# Patient Record
Sex: Female | Born: 1955 | Race: White | Hispanic: No | Marital: Single
Health system: Midwestern US, Community
[De-identification: ages and names within clinical notes are randomized; demographics above are authoritative.]

## PROBLEM LIST (undated history)

## (undated) DIAGNOSIS — D509 Iron deficiency anemia, unspecified: Secondary | ICD-10-CM

## (undated) DIAGNOSIS — K449 Diaphragmatic hernia without obstruction or gangrene: Secondary | ICD-10-CM

## (undated) DIAGNOSIS — K388 Other specified diseases of appendix: Secondary | ICD-10-CM

## (undated) DIAGNOSIS — M545 Low back pain: Secondary | ICD-10-CM

## (undated) DIAGNOSIS — D508 Other iron deficiency anemias: Secondary | ICD-10-CM

## (undated) DIAGNOSIS — R0602 Shortness of breath: Secondary | ICD-10-CM

---

## 2013-11-25 LAB — COMPREHENSIVE METABOLIC PANEL
ALT: 321 U/L — ABNORMAL HIGH (ref 0–33)
AST: 115 U/L — ABNORMAL HIGH (ref 0–35)
Albumin: 4 g/dL (ref 3.9–4.9)
Alkaline Phosphatase: 247 U/L — ABNORMAL HIGH (ref 40–130)
Anion Gap: 15 mEq/L — ABNORMAL HIGH (ref 7–13)
BUN: 11 mg/dL (ref 6–20)
CO2: 23 mEq/L (ref 22–29)
Calcium: 9.3 mg/dL (ref 8.6–10.2)
Chloride: 105 mEq/L (ref 98–107)
Creatinine: 0.82 mg/dL (ref 0.50–0.90)
GFR African American: >60 (ref >60)
GFR Non-African American: >60 (ref >60)
Globulin: 3 g/dL (ref 2.3–3.5)
Glucose: 105 mg/dL (ref 74–109)
Potassium: 4.5 mEq/L (ref 3.5–5.1)
Sodium: 143 mEq/L (ref 132–144)
Total Bilirubin: 0.5 mg/dL (ref 0.0–1.2)
Total Protein: 7 g/dL (ref 6.4–8.1)

## 2013-11-25 LAB — TSH, HIGH SENSITIVE: TSH: 4.11 u[IU]/mL (ref 0.270–4.200)

## 2013-11-25 LAB — VITAMIN D 25 HYDROXY: Vit D, 25-Hydroxy: 40.5 ng/mL (ref 30.0–100.0)

## 2013-11-25 LAB — T4, FREE: T4 Free: 0.84 ng/dL — ABNORMAL LOW (ref 0.93–1.70)

## 2017-10-18 ENCOUNTER — Encounter

## 2017-10-18 ENCOUNTER — Inpatient Hospital Stay: Admit: 2017-10-18 | Payer: PRIVATE HEALTH INSURANCE

## 2017-10-18 DIAGNOSIS — M545 Low back pain: Secondary | ICD-10-CM

## 2018-11-10 ENCOUNTER — Emergency Department: Admit: 2018-11-10 | Payer: PRIVATE HEALTH INSURANCE

## 2018-11-10 ENCOUNTER — Inpatient Hospital Stay
Admit: 2018-11-10 | Discharge: 2018-11-10 | Disposition: A | Discharge status: Home / Self Care | Payer: PRIVATE HEALTH INSURANCE | Attending: Emergency Medicine

## 2018-11-10 DIAGNOSIS — N390 Urinary tract infection, site not specified: Secondary | ICD-10-CM

## 2018-11-10 LAB — COMPREHENSIVE METABOLIC PANEL
ALT: 29 U/L (ref 0–33)
AST: 26 U/L (ref 0–35)
Albumin: 4.2 g/dL (ref 3.5–4.6)
Alkaline Phosphatase: 136 U/L — ABNORMAL HIGH (ref 40–130)
Anion Gap: 13 mEq/L (ref 9–15)
BUN: 20 mg/dL (ref 8–23)
CO2: 22 mEq/L (ref 20–31)
Calcium: 9.9 mg/dL (ref 8.5–9.9)
Chloride: 107 mEq/L (ref 95–107)
Creatinine: 1.07 mg/dL — ABNORMAL HIGH (ref 0.50–0.90)
GFR African American: >60 (ref >60)
GFR Non-African American: 51.7 — ABNORMAL LOW (ref >60)
Globulin: 4 g/dL — ABNORMAL HIGH (ref 2.3–3.5)
Glucose: 177 mg/dL — ABNORMAL HIGH (ref 70–99)
Potassium: 4.7 mEq/L (ref 3.4–4.9)
Sodium: 142 mEq/L (ref 135–144)
Total Bilirubin: 0.3 mg/dL (ref 0.2–0.7)
Total Protein: 8.2 g/dL — ABNORMAL HIGH (ref 6.3–8.0)

## 2018-11-10 LAB — URINALYSIS WITH REFLEX TO CULTURE
Blood, Urine: NEGATIVE
Glucose, Ur: NEGATIVE mg/dL
Nitrite, Urine: NEGATIVE
Protein, UA: NEGATIVE mg/dL
Specific Gravity, UA: 1.025 (ref 1.005–1.030)
Urobilinogen, Urine: 0.2 E.U./dL (ref <2.0)
pH, UA: 5.5 (ref 5.0–9.0)

## 2018-11-10 LAB — CBC WITH AUTO DIFFERENTIAL
Bands Relative: 0 % — ABNORMAL LOW (ref 5–11)
Basophils %: 0 %
Basophils Absolute: 0 10*3/uL (ref 0.0–0.2)
Eosinophils %: 0 %
Eosinophils Absolute: 0 10*3/uL (ref 0.0–0.7)
Hematocrit: 28.6 % — ABNORMAL LOW (ref 37.0–47.0)
Hemoglobin: 8.7 g/dL — ABNORMAL LOW (ref 12.0–16.0)
Lymphocytes %: 14 %
Lymphocytes Absolute: 1.5 10*3/uL (ref 1.0–4.8)
MCH: 21.2 pg — ABNORMAL LOW (ref 27.0–31.3)
MCHC: 30.5 % — ABNORMAL LOW (ref 33.0–37.0)
MCV: 69.4 fL — ABNORMAL LOW (ref 82.0–100.0)
Monocytes %: 4 %
Monocytes Absolute: 0.4 10*3/uL (ref 0.2–0.8)
Neutrophils %: 82 %
Neutrophils Absolute: 9 10*3/uL — ABNORMAL HIGH (ref 1.4–6.5)
PLATELET SLIDE REVIEW: INCREASED
Platelets: 522 10*3/uL — ABNORMAL HIGH (ref 130–400)
RBC: 4.12 M/uL — ABNORMAL LOW (ref 4.20–5.40)
RDW: 21.6 % — ABNORMAL HIGH (ref 11.5–14.5)
WBC: 11 10*3/uL — ABNORMAL HIGH (ref 4.8–10.8)

## 2018-11-10 LAB — EKG 12-LEAD
Atrial Rate: 100 {beats}/min
P Axis: 54 degrees
P-R Interval: 140 ms
Q-T Interval: 386 ms
QRS Duration: 128 ms
QTc Calculation (Bazett): 497 ms
R Axis: -21 degrees
T Axis: 97 degrees
Ventricular Rate: 100 {beats}/min

## 2018-11-10 LAB — TROPONIN: Troponin: <0.01 ng/mL (ref 0.000–0.010)

## 2018-11-10 LAB — MICROSCOPIC URINALYSIS

## 2018-11-10 LAB — APTT: aPTT: 24.7 s (ref 24.4–36.8)

## 2018-11-10 LAB — CK: Total CK: 50 U/L (ref 0–170)

## 2018-11-10 LAB — D-DIMER, QUANTITATIVE: D-Dimer, Quant: 0.27 mg/L FEU (ref 0.00–0.50)

## 2018-11-10 LAB — PROTIME-INR
INR: 1
Protime: 13.5 s (ref 12.3–14.9)

## 2018-11-10 LAB — BRAIN NATRIURETIC PEPTIDE: Pro-BNP: 95 pg/mL

## 2018-11-10 MED ORDER — ALPRAZOLAM 0.25 MG PO TABS
0.25 MG | ORAL_TABLET | Freq: Three times a day (TID) | ORAL | 0 refills | Status: AC | PRN
Start: 2018-11-10 — End: 2018-12-10

## 2018-11-10 MED ORDER — SULFAMETHOXAZOLE-TRIMETHOPRIM 800-160 MG PO TABS
800-160 MG | ORAL_TABLET | Freq: Two times a day (BID) | ORAL | 0 refills | Status: AC
Start: 2018-11-10 — End: 2018-11-17

## 2018-11-10 MED ORDER — LORAZEPAM 2 MG/ML IJ SOLN
2 MG/ML | Freq: Once | INTRAMUSCULAR | Status: AC
Start: 2018-11-10 — End: 2018-11-10
  Administered 2018-11-10: 22:00:00 1 mg via INTRAVENOUS

## 2018-11-10 MED ORDER — ASPIRIN 81 MG PO TBEC
81 MG | ORAL_TABLET | Freq: Every day | ORAL | 3 refills | Status: AC
Start: 2018-11-10 — End: ?

## 2018-11-10 MED ORDER — FERROUS SULFATE 325 (65 FE) MG PO TABS
325 (65 Fe) MG | ORAL_TABLET | Freq: Two times a day (BID) | ORAL | 0 refills | Status: AC
Start: 2018-11-10 — End: ?

## 2018-11-10 MED ORDER — ESOMEPRAZOLE MAGNESIUM 40 MG PO CPDR
40 MG | ORAL_CAPSULE | Freq: Every day | ORAL | 2 refills | Status: AC
Start: 2018-11-10 — End: ?

## 2018-11-10 MED ORDER — KETOROLAC TROMETHAMINE 30 MG/ML IJ SOLN
30 MG/ML | Freq: Once | INTRAMUSCULAR | Status: AC
Start: 2018-11-10 — End: 2018-11-10
  Administered 2018-11-10: 22:00:00 30 mg via INTRAVENOUS

## 2018-11-10 MED ORDER — SODIUM CHLORIDE 0.9 % IV BOLUS
0.9 % | Freq: Once | INTRAVENOUS | Status: AC
Start: 2018-11-10 — End: 2018-11-10
  Administered 2018-11-10: 22:00:00 1000 mL via INTRAVENOUS

## 2018-11-10 MED FILL — ATIVAN 2 MG/ML IJ SOLN: 2 mg/mL | INTRAMUSCULAR | Qty: 1

## 2018-11-10 MED FILL — KETOROLAC TROMETHAMINE 30 MG/ML IJ SOLN: 30 mg/mL | INTRAMUSCULAR | Qty: 1

## 2018-11-10 MED FILL — SODIUM CHLORIDE 0.9 % IV SOLN: 0.9 % | INTRAVENOUS | Qty: 1000

## 2018-11-10 NOTE — ED Provider Notes (Addendum)
[facility] ED  eMERGENCY dEPARTMENT eNCOUnter      Pt Name: Brooke Porter  MRN: [id number]  Birthdate 1956/08/08  Date of evaluation: 11/10/2018  Provider: Magdalene Molly, MD    CHIEF COMPLAINT       Chief Complaint   Patient presents with   ??? Shortness of Breath     upon exertion starting today    ??? Fatigue     for years          HISTORY OF PRESENT ILLNESS   (Location/Symptom, Timing/Onset,Context/Setting, Quality, Duration, Modifying Factors, Severity)  Note limiting factors.   Brooke Porter is a 62 y.o. female who presents to the emergency department with complaint of generalized anxiety, exertional dyspnea, fatigue, of 1 year duration.  Began with chest pressure, shortness of breath this morning.  Was seen by family doctor last week with echocardiogram scheduled in further  work-up.  Chest pressure is 7 in a scale of 1-10.    HPI    Nursing Notes were reviewed.    REVIEW OF SYSTEMS    (2-9 systems for level 4, 10 or more for level 5)     Review of Systems   Constitutional: Positive for fatigue. Negative for activity change, appetite change, chills and fever.   HENT: Negative for congestion, ear discharge, ear pain, hearing loss, rhinorrhea, sinus pressure and sore throat.    Eyes: Negative for photophobia, pain and visual disturbance.   Respiratory: Positive for shortness of breath. Negative for apnea, cough and wheezing.    Cardiovascular: Positive for chest pain. Negative for palpitations and leg swelling.   Gastrointestinal: Negative for abdominal distention, abdominal pain, constipation, diarrhea, nausea and vomiting.   Endocrine: Negative for cold intolerance, heat intolerance and polyuria.   Genitourinary: Negative for dysuria, flank pain, frequency and urgency.   Musculoskeletal: Positive for arthralgias and myalgias. Negative for back pain, gait problem and neck stiffness.   Skin: Negative for color change, pallor and rash.   Allergic/Immunologic: Negative for food allergies and  immunocompromised state.   Neurological: Positive for weakness. Negative for dizziness, tremors, syncope, light-headedness and headaches.   Psychiatric/Behavioral: Negative for agitation, confusion and hallucinations. The patient is nervous/anxious.    All other systems reviewed and are negative.      Except as noted above the remainder of the review of systems was reviewed and negative.       PAST MEDICAL HISTORY     Past Medical History:   Diagnosis Date   ??? Depression    ??? Hiatal hernia    ??? Hyperlipidemia    ??? Overactive bladder    ??? Social anxiety disorder    ??? Thyroid disease    ??? Vitamin D deficiency          SURGICAL HISTORY       Past Surgical History:   Procedure Laterality Date   ??? CHOLECYSTECTOMY     ??? HYSTERECTOMY     ??? TONSILLECTOMY           CURRENT MEDICATIONS       Discharge Medication List as of 11/10/2018  6:28 PM      CONTINUE these medications which have NOT CHANGED    Details   oxybutynin (DITROPAN-XL) 10 MG extended release tablet Historical Med      buPROPion (WELLBUTRIN XL) 300 MG extended release tablet Take 300 mg by mouth dailyHistorical Med      DULoxetine (CYMBALTA) 60 MG extended release capsule Take 120 mg by  mouth dailyHistorical Med      levothyroxine (SYNTHROID) 100 MCG tablet Take 100 mcg by mouth dailyHistorical Med      ergocalciferol (ERGOCALCIFEROL) 1.25 MG (50000 UT) capsule Take 50,000 Units by mouth once a week Labeling may look different.  200 mcg=8000 Units. Please double check dosages.Historical Med      lisinopril (PRINIVIL;ZESTRIL) 10 MG tablet Take 10 mg by mouthHistorical Med             ALLERGIES     Levofloxacin    FAMILY HISTORY     History reviewed. No pertinent family history.       SOCIAL HISTORY       Social History     Socioeconomic History   ??? Marital status: Single     Spouse name: None   ??? Number of children: None   ??? Years of education: None   ??? Highest education level: None   Occupational History   ??? None   Social Needs   ??? Financial resource strain:  None   ??? Food insecurity:     Worry: None     Inability: None   ??? Transportation needs:     Medical: None     Non-medical: None   Tobacco Use   ??? Smoking status: Never Smoker   ??? Smokeless tobacco: Never Used   Substance and Sexual Activity   ??? Alcohol use: Not Currently   ??? Drug use: Never   ??? Sexual activity: None   Lifestyle   ??? Physical activity:     Days per week: None     Minutes per session: None   ??? Stress: None   Relationships   ??? Social connections:     Talks on phone: None     Gets together: None     Attends religious service: None     Active member of club or organization: None     Attends meetings of clubs or organizations: None     Relationship status: None   ??? Intimate partner violence:     Fear of current or ex partner: None     Emotionally abused: None     Physically abused: None     Forced sexual activity: None   Other Topics Concern   ??? None   Social History Narrative   ??? None       SCREENINGS    Glasgow Coma Scale  Eye Opening: Spontaneous  Best Verbal Response: Oriented  Best Motor Response: Obeys commands  Glasgow Coma Scale Score: 15        PHYSICAL EXAM    (up to 7 for level 4, 8 or more for level 5)     ED Triage Vitals [11/10/18 1609]   BP Temp Temp Source Pulse Resp SpO2 Height Weight   119/68 97.8 ??F (36.6 ??C) Oral 119 16 99 % 5\' 8"  (1.727 m) 228 lb (103.4 kg)       Physical Exam  Vitals signs and nursing note reviewed.   Constitutional:       General: She is not in acute distress.     Appearance: Normal appearance. She is well-developed. She is obese. She is ill-appearing. She is not toxic-appearing or diaphoretic.   HENT:      Head: Normocephalic and atraumatic.      Nose: Nose normal. No congestion or rhinorrhea.      Mouth/Throat:      Mouth: Mucous membranes are moist.      Pharynx: Oropharynx is  clear. No oropharyngeal exudate or posterior oropharyngeal erythema.   Eyes:      General: No scleral icterus.        Right eye: No discharge.         Left eye: No discharge.       Conjunctiva/sclera: Conjunctivae normal.      Pupils: Pupils are equal, round, and reactive to light.      Comments: Pale conjuctivae   Neck:      Musculoskeletal: Normal range of motion and neck supple. No neck rigidity or muscular tenderness.      Thyroid: No thyromegaly.      Vascular: No carotid bruit or JVD.      Trachea: No tracheal deviation.   Cardiovascular:      Rate and Rhythm: Regular rhythm. Tachycardia present.      Pulses: Normal pulses.      Heart sounds: Normal heart sounds. No murmur. No friction rub. No gallop.    Pulmonary:      Effort: Pulmonary effort is normal. No respiratory distress.      Breath sounds: No stridor. Rales present. No wheezing or rhonchi.   Chest:      Chest wall: No tenderness.   Abdominal:      General: Bowel sounds are normal. There is no distension.      Palpations: Abdomen is soft. There is no mass.      Tenderness: There is no tenderness. There is no right CVA tenderness, left CVA tenderness, guarding or rebound.      Hernia: No hernia is present.   Musculoskeletal: Normal range of motion.         General: No swelling, tenderness, deformity or signs of injury.      Right lower leg: No edema.      Left lower leg: No edema.   Lymphadenopathy:      Cervical: No cervical adenopathy.   Skin:     General: Skin is warm and dry.      Coloration: Skin is not jaundiced or pale.      Findings: No bruising, erythema, lesion or rash.   Neurological:      Mental Status: She is alert and oriented to person, place, and time.      Cranial Nerves: No cranial nerve deficit.      Sensory: No sensory deficit.      Motor: Weakness present. No abnormal muscle tone.      Coordination: Coordination normal.      Gait: Gait normal.      Deep Tendon Reflexes: Reflexes are normal and symmetric. Reflexes normal.   Psychiatric:         Behavior: Behavior normal.         Thought Content: Thought content normal.         Judgment: Judgment normal.      Comments: Mood is depressed         DIAGNOSTIC  RESULTS     EKG: All EKG's are interpreted by the Emergency Department Physician who either signs or Co-signs this chart in the absence of a cardiologist.    Twelve-lead EKG shows sinus rhythm, rate 100 bpm, left axis deviation, left bundle branch block, no acute ST-T wave changes.    RADIOLOGY:   Non-plain film images such as CT, Ultrasound and MRI are read by the radiologist. Plain radiographicimages are visualized and preliminarily interpreted by the emergency physician with the below findings:    Chest x-ray shows no acute cardiopulmonary pathology.  Interpretation per the Radiologist below, if available at the time of this note:    XR CHEST PORTABLE    (Results Pending)         ED BEDSIDE ULTRASOUND:   Performed by ED Physician - none    LABS:  Labs Reviewed   COMPREHENSIVE METABOLIC PANEL - Abnormal; Notable for the following components:       Result Value    Glucose 177 (*)     CREATININE 1.07 (*)     GFR Non-African American 51.7 (*)     Total Protein 8.2 (*)     Alkaline Phosphatase 136 (*)     Globulin 4.0 (*)     All other components within normal limits   CBC WITH AUTO DIFFERENTIAL - Abnormal; Notable for the following components:    WBC 11.0 (*)     RBC 4.12 (*)     Hemoglobin 8.7 (*)     Hematocrit 28.6 (*)     MCV 69.4 (*)     MCH 21.2 (*)     MCHC 30.5 (*)     RDW 21.6 (*)     Platelets 522 (*)     Neutrophils Absolute 9.0 (*)     Bands Relative 0 (*)     All other components within normal limits   IRON AND TIBC - Abnormal; Notable for the following components:    Iron 17 (*)     Iron Saturation 4 (*)     All other components within normal limits   FERRITIN - Abnormal; Notable for the following components:    Ferritin 7.0 (*)     All other components within normal limits   MICROSCOPIC URINALYSIS - Abnormal; Notable for the following components:    WBC, UA 6-10 (*)     All other components within normal limits   URINE CULTURE   PROTIME-INR   APTT   CK   TROPONIN   D-DIMER, QUANTITATIVE   BRAIN  NATRIURETIC PEPTIDE   URINE RT REFLEX TO CULTURE       All other labs were within normal range or not returned as of this dictation.    EMERGENCY DEPARTMENT COURSE and DIFFERENTIALDIAGNOSIS/MDM:   Vitals:    Vitals:    11/10/18 1723 11/10/18 1745 11/10/18 1748 11/10/18 1846   BP: (!) 101/56 113/62 113/62 110/66   Pulse:   83 84   Resp: 16  16 16    Temp:   98 ??F (36.7 ??C) 98.4 ??F (36.9 ??C)   TempSrc:   Oral Oral   SpO2:   96% 98%   Weight:       Height:               MDM  Number of Diagnoses or Management Options     Amount and/or Complexity of Data Reviewed  Clinical lab tests: reviewed and ordered  Tests in the radiology section of CPT??: reviewed and ordered  Tests in the medicine section of CPT??: ordered and reviewed    Risk of Complications, Morbidity, and/or Mortality  Presenting problems: moderate  Diagnostic procedures: moderate  Management options: moderate    Patient Progress  Patient progress: improved      CRITICAL CARE TIME   Total Critical Care time was  minutes, excluding separately reportable procedures.  There was a high probability of clinically significant/life threatening deterioration in the patient's condition which required my urgentintervention.     CONSULTS:  None    PROCEDURES:  Unless otherwise noted  below, none     Procedures    FINAL IMPRESSION      1. General weakness    2. Microcytic anemia    3. Anxiety state    4. Dyspnea, unspecified type    5. Abnormal EKG    6. Acute UTI          DISPOSITION/PLAN   DISPOSITION Decision To Discharge 11/10/2018 06:46:07 PM      PATIENT REFERRED [place]  [address], [address]  [place] [address]  [phone]    In 2 days      [place]  [address], [address]  [place] [address]  [phone]            DISCHARGE MEDICATIONS:  Discharge Medication List as of 11/10/2018  6:28 PM      START taking these medications    Details   aspirin (ECOTRIN LOW STRENGTH) 81 MG EC tablet Take 1 tablet by mouth  daily, Disp-30 tablet, R-3Print      esomeprazole (NEXIUM) 40 MG delayed release capsule Take 1 capsule by mouth every morning (before breakfast), Disp-30 capsule, R-2Print      ALPRAZolam (XANAX) 0.25 MG tablet Take 1 tablet by mouth 3 times daily as needed for Anxiety for up to 30 days., Disp-10 tablet, R-0Print      ferrous sulfate 325 (65 Fe) MG tablet Take 1 tablet by mouth 2 times daily, Disp-60 tablet, R-0Print      sulfamethoxazole-trimethoprim (BACTRIM DS) 800-160 MG per tablet Take 1 tablet by mouth 2 times daily for 7 days, Disp-14 tablet, R-0Print                (Please note that portions of this note were completed with a voice recognitionprogram.  Efforts were made to edit the dictations but occasionally words are mis-transcribed.)    Magdalene Molly, MD (electronically signed)  Attending Emergency Physician          Magdalene Molly, MD  11/10/18 1758       Magdalene Molly, MD  11/10/18 2207

## 2018-11-10 NOTE — ED Notes (Signed)
Patient updated on POC. Family at bedside.      Caren HazyAngela S Herve Haug, RN  11/10/18 1723

## 2018-11-10 NOTE — ED Triage Notes (Signed)
Patient to room #8 for c/o shortness of breath x1 year and worsening, heaviness in chest x3 days, tightness under left shoulder blade x1 day, and throat tightness x3 weeks. Patient placed on monitor, EKG obtained, [facility], labs drawn and sent.

## 2018-11-11 LAB — IRON AND TIBC
Iron % Saturation: 4 % — ABNORMAL LOW (ref 11–46)
Iron: 17 ug/dL — ABNORMAL LOW (ref 37–145)
TIBC: 414 ug/dL (ref 178–450)

## 2018-11-11 LAB — FERRITIN: Ferritin: 7 ng/mL — ABNORMAL LOW (ref 13.0–150.0)

## 2018-11-12 ENCOUNTER — Ambulatory Visit: Admit: 2018-11-12 | Discharge: 2018-11-12 | Payer: PRIVATE HEALTH INSURANCE | Attending: Gastroenterology

## 2018-11-12 DIAGNOSIS — D509 Iron deficiency anemia, unspecified: Secondary | ICD-10-CM

## 2018-11-12 LAB — CULTURE, URINE: Urine Culture, Routine: NO GROWTH

## 2018-11-12 NOTE — Progress Notes (Signed)
Subjective:      Patient ID: Brooke Porter is a 61 y.o. female who presents today for:  Chief Complaint   Patient presents with   . Consultation   . GI Bleeding   . Gastroesophageal Reflux   . Nausea       HPI  This is a very pleasant 62 year old who came in today for further evaluation and management of iron deficiency anemia.  Patient denies any overt bleeding in term of hematemesis hematochezia, or black stool/melena.  She reported that she has been having fatigue and tiredness over the last year or two.  She does report exertional dyspnea.  Patient mentioned that she had the EGD and colonoscopy at outside facility within 1 year or almost 1 year back.  And was negative except for a hiatal hernia.  At the time of the EGD and colonoscopy patient was reporting exertional shortness of breath.  Current hemoglobin is 8.7 from 9.4 in 03/12/2018.  Noted hemoglobin 11.8 in 2018 down from 13.8 in 2016.  Otherwise patient has iron studies and with evidence of  iron deficiency anemia.  Denies NSAID use.  No anticoagulants reported.  Patient mentioned that she had sent EKG that shows left branch bundle block and currently has been followed with cardiology was told to have normal EKG prior to that.  She came in today for initial visit  Past Medical History:   Diagnosis Date   . Depression    . Hiatal hernia    . Hyperlipidemia    . Overactive bladder    . Social anxiety disorder    . Thyroid disease    . Vitamin D deficiency      Past Surgical History:   Procedure Laterality Date   . CHOLECYSTECTOMY     . HYSTERECTOMY     . TONSILLECTOMY       Social History     Socioeconomic History   . Marital status: Single     Spouse name: Not on file   . Number of children: Not on file   . Years of education: Not on file   . Highest education level: Not on file   Occupational History   . Not on file   Social Needs   . Financial resource strain: Not on file   . Food insecurity:     Worry: Not on file     Inability: Not on file   .  Transportation needs:     Medical: Not on file     Non-medical: Not on file   Tobacco Use   . Smoking status: Never Smoker   . Smokeless tobacco: Never Used   Substance and Sexual Activity   . Alcohol use: Not Currently   . Drug use: Never   . Sexual activity: Not on file   Lifestyle   . Physical activity:     Days per week: Not on file     Minutes per session: Not on file   . Stress: Not on file   Relationships   . Social connections:     Talks on phone: Not on file     Gets together: Not on file     Attends religious service: Not on file     Active member of club or organization: Not on file     Attends meetings of clubs or organizations: Not on file     Relationship status: Not on file   . Intimate partner violence:     Fear of  current or ex partner: Not on file     Emotionally abused: Not on file     Physically abused: Not on file     Forced sexual activity: Not on file   Other Topics Concern   . Not on file   Social History Narrative   . Not on file     Family History   Problem Relation Age of Onset   . Celiac Disease Neg Hx    . Colon Cancer Neg Hx    . Crohn's Disease Neg Hx      Allergies   Allergen Reactions   . Levofloxacin      Other reaction(s): Other: See Comments  tendinitis all over the muscles  Other reaction(s): Other: See Comments  tendinitis all over the muscles           Review of Systems   Constitutional: Negative for appetite change, chills, fatigue, fever and unexpected weight change.   HENT: Negative for nosebleeds, tinnitus, trouble swallowing and voice change.    Eyes: Negative for photophobia, pain and redness.   Respiratory: Positive for shortness of breath. Negative for chest tightness and wheezing.    Cardiovascular: Negative for chest pain, palpitations and leg swelling.   Gastrointestinal: Negative for abdominal distention, abdominal pain, blood in stool, constipation, diarrhea, nausea, rectal pain and vomiting.   Endocrine: Negative for polydipsia, polyphagia and polyuria.    Genitourinary: Negative for difficulty urinating and hematuria.   Skin: Negative for color change, pallor and rash.   Neurological: Negative for dizziness, speech difficulty and headaches.   Psychiatric/Behavioral: Negative for confusion and suicidal ideas.       Objective:   BP 136/62 (Site: Left Upper Arm, Position: Sitting, Cuff Size: Large Adult)   Pulse 111   Ht 5\' 8"  (1.727 m)   Wt 232 lb (105.2 kg)   SpO2 98%   BMI 35.28 kg/m     Physical Exam  Constitutional:       General: She is not in acute distress.     Appearance: She is well-developed.   HENT:      Head: Normocephalic and atraumatic.   Eyes:      Conjunctiva/sclera: Conjunctivae normal.      Pupils: Pupils are equal, round, and reactive to light.   Neck:      Musculoskeletal: Neck supple.   Cardiovascular:      Rate and Rhythm: Normal rate and regular rhythm.      Heart sounds: Normal heart sounds.   Pulmonary:      Effort: Pulmonary effort is normal. No respiratory distress.      Breath sounds: Normal breath sounds. No wheezing or rales.   Abdominal:      General: Bowel sounds are normal. There is no distension.      Palpations: Abdomen is soft. Abdomen is not rigid. There is no hepatomegaly, splenomegaly or mass.      Tenderness: There is no tenderness. There is no guarding or rebound.   Musculoskeletal: Normal range of motion.         General: No tenderness or deformity.   Skin:     Coloration: Skin is not pale.      Findings: No erythema or rash.   Neurological:      Mental Status: She is alert and oriented to person, place, and time.         Laboratory, Pathology, Radiology reviewed in detail with relevantimportant investigations summarized below:  Lab Results   Component Value Date  WBC 11.0 11/10/2018    HGB 8.7 11/10/2018    HCT 28.6 11/10/2018    MCV 69.4 11/10/2018    PLT 522 11/10/2018    .  Lab Results   Component Value Date    ALT 29 11/10/2018    ALT 321 11/25/2013    AST 26 11/10/2018    AST 115 11/25/2013    ALKPHOS 136  11/10/2018    ALKPHOS 247 11/25/2013    BILITOT 0.3 11/10/2018    BILITOT 0.5 11/25/2013       Xr Chest Portable    Result Date: 11/11/2018  EXAMINATION: XR CHEST PORTABLE CLINICAL HISTORY:  shortness of breath COMPARISONS: 07/25/2010 FINDINGS: Cardiac size is borderline. Pulmonary vascularity is normal. The lungs are clear. There are scattered calcifications from old granulomatous disease. There are degenerative changes of the shoulders. There is spinal fusion hardware in the lower cervical spine. There is an old healed fracture of the anterior right 6th rib.     NO ACUTE CHEST DISEASE    Lab Results   Component Value Date    IRON 17 11/10/2018    TIBC 414 11/10/2018    FERRITIN 7.0 11/10/2018     Lab Results   Component Value Date    INR 1.0 11/10/2018     No components found for: ACUTEHEPATITISSCREEN  No components found for: CELIACPANEL  No components found for: STOOLCULTURE, C.DIFF, STOOLOVAPARASITE, STOOLLEUCOCYTE        Assessment:       Diagnosis Orders   1. Iron deficiency anemia, unspecified iron deficiency anemia type  Endoscopy, GI with capsule         Plan:      Orders Placed This Encounter   Procedures   . Endoscopy, GI with capsule     Standing Status:   Future     Standing Expiration Date:   12/12/2018     Order Specific Question:   Pre-procedure Diagnosis     Answer:   ida     Order Specific Question:   Pre-admission testing needed?     Answer:   No [0]   Iron deficiency anemia/ IDA  No overt bleeding reported at this time  Patient had EGD colonoscopy within 1 year and reported to be negative except for hiatal hernia  Will obtain endoscopy report for review.  If negative, we will proceed with capsule endoscopy  Patient may need a repeat EGD/ colonoscopy pending findings  Continue iron supplements.  Hold 1 week prior to capsule  Left branch branch bundle block   With exertional shortness of breath.  She reports that she was told to have abnormal EKG which is new compared to previous.  Currently patient  has been following with cardiology.  Will need clearance prior to proceeding to repeat endoscopy  Associated medical conditions   hiatal hernia, anxiety depression, dyslipidemia, thyroid disorder  Return in about 4 weeks (around 12/10/2018) for Post procedure results discussion, further management.      Thamas Jaegers, MD

## 2018-11-26 ENCOUNTER — Ambulatory Visit: Admit: 2018-11-26 | Discharge: 2018-11-26 | Payer: PRIVATE HEALTH INSURANCE | Attending: Gastroenterology

## 2018-11-26 DIAGNOSIS — D509 Iron deficiency anemia, unspecified: Secondary | ICD-10-CM

## 2018-11-26 MED ORDER — NA SULFATE-K SULFATE-MG SULF 17.5-3.13-1.6 GM/177ML PO SOLN
ORAL | 0 refills | Status: AC
Start: 2018-11-26 — End: ?

## 2018-11-26 NOTE — Progress Notes (Signed)
Subjective:      Patient ID: Brooke Porter is a 62 y.o. female who presents today for:  Chief Complaint   Patient presents with   ??? Follow-up   ??? Anemia       Today visit:  Patient came in today for follow-up visit.  Since last seen, patient had cardiac catheterization that was negative for coronary artery disease.  Patient received IV iron during recent hospital stay at [facility].  And came in today for follow-up and future plan of care.  No overt bleeding reported in terms of hematemesis melena hematochezia.  Background  This is a very pleasant 62 year old who came in today for further evaluation and management of iron deficiency anemia.  Patient denies any overt bleeding in term of hematemesis hematochezia, or black stool/melena.  She reported that she has been having fatigue and tiredness over the last year or two.  She does report exertional dyspnea.  Patient mentioned that she had the EGD and colonoscopy at outside facility within 1 year or almost 1 year back.  And was negative except for a hiatal hernia.  At the time of the EGD and colonoscopy patient was reporting exertional shortness of breath.  Current hemoglobin is 8.7 from 9.4 in 03/12/2018.  Noted hemoglobin 11.8 in 2018 down from 13.8 in 2016.  Otherwise patient has iron studies and with evidence of  iron deficiency anemia.  Denies NSAID use.  No anticoagulants reported.  Patient mentioned that she had sent EKG that shows left branch bundle block and currently has been followed with cardiology was told to have normal EKG prior to that.  She came in today for initial visit  Past Medical History:   Diagnosis Date   ??? Depression    ??? Hiatal hernia    ??? Hyperlipidemia    ??? Overactive bladder    ??? Social anxiety disorder    ??? Thyroid disease    ??? Vitamin D deficiency      Past Surgical History:   Procedure Laterality Date   ??? CHOLECYSTECTOMY     ??? HYSTERECTOMY     ??? TONSILLECTOMY       Social History     Socioeconomic History   ??? Marital status: Single      Spouse name: Not on file   ??? Number of children: Not on file   ??? Years of education: Not on file   ??? Highest education level: Not on file   Occupational History   ??? Not on file   Social Needs   ??? Financial resource strain: Not on file   ??? Food insecurity:     Worry: Not on file     Inability: Not on file   ??? Transportation needs:     Medical: Not on file     Non-medical: Not on file   Tobacco Use   ??? Smoking status: Never Smoker   ??? Smokeless tobacco: Never Used   Substance and Sexual Activity   ??? Alcohol use: Not Currently   ??? Drug use: Never   ??? Sexual activity: Not on file   Lifestyle   ??? Physical activity:     Days per week: Not on file     Minutes per session: Not on file   ??? Stress: Not on file   Relationships   ??? Social connections:     Talks on phone: Not on file     Gets together: Not on file     Attends religious service:  Not on file     Active member of club or organization: Not on file     Attends meetings of clubs or organizations: Not on file     Relationship status: Not on file   ??? Intimate partner violence:     Fear of current or ex partner: Not on file     Emotionally abused: Not on file     Physically abused: Not on file     Forced sexual activity: Not on file   Other Topics Concern   ??? Not on file   Social History Narrative   ??? Not on file     Family History   Problem Relation Age of Onset   ??? Celiac Disease Neg Hx    ??? Colon Cancer Neg Hx    ??? Crohn's Disease Neg Hx      Allergies   Allergen Reactions   ??? Levofloxacin      Other reaction(s): Other: See Comments  tendinitis all over the muscles  Other reaction(s): Other: See Comments  tendinitis all over the muscles           Review of Systems   Constitutional: Negative for appetite change, chills, fatigue, fever and unexpected weight change.   HENT: Negative for nosebleeds, tinnitus, trouble swallowing and voice change.    Eyes: Negative for photophobia, pain and redness.   Respiratory: Negative for chest tightness, shortness of breath and  wheezing.    Cardiovascular: Negative for chest pain, palpitations and leg swelling.   Gastrointestinal: Negative for abdominal distention, abdominal pain, blood in stool, constipation, diarrhea, nausea, rectal pain and vomiting.   Endocrine: Negative for polydipsia, polyphagia and polyuria.   Genitourinary: Negative for difficulty urinating and hematuria.   Skin: Negative for color change, pallor and rash.   Neurological: Negative for dizziness, speech difficulty and headaches.   Psychiatric/Behavioral: Negative for confusion and suicidal ideas.       Objective:   BP 122/72 (Site: Right Upper Arm, Position: Sitting, Cuff Size: Medium Adult)    Pulse 90    Ht 5\' 8"  (1.727 m)    Wt 225 lb (102.1 kg)    SpO2 98%    BMI 34.21 kg/m??     Physical Exam  Constitutional:       General: She is not in acute distress.     Appearance: She is well-developed.   HENT:      Head: Normocephalic and atraumatic.   Eyes:      Conjunctiva/sclera: Conjunctivae normal.      Pupils: Pupils are equal, round, and reactive to light.   Neck:      Musculoskeletal: Neck supple.   Cardiovascular:      Rate and Rhythm: Normal rate and regular rhythm.      Heart sounds: Normal heart sounds.   Pulmonary:      Effort: Pulmonary effort is normal. No respiratory distress.      Breath sounds: Normal breath sounds. No wheezing or rales.   Abdominal:      General: Bowel sounds are normal. There is no distension.      Palpations: Abdomen is soft. Abdomen is not rigid. There is no hepatomegaly, splenomegaly or mass.      Tenderness: There is no tenderness. There is no guarding or rebound.   Musculoskeletal: Normal range of motion.         General: No tenderness or deformity.   Skin:     Coloration: Skin is not pale.  Findings: No erythema or rash.   Neurological:      Mental Status: She is alert and oriented to person, place, and time.         Laboratory, Pathology, Radiology reviewed in detail with relevantimportant investigations summarized  below:  Lab Results   Component Value Date    WBC 11.0 11/10/2018    HGB 8.7 11/10/2018    HCT 28.6 11/10/2018    MCV 69.4 11/10/2018    PLT 522 11/10/2018    .  Lab Results   Component Value Date    ALT 29 11/10/2018    ALT 321 11/25/2013    AST 26 11/10/2018    AST 115 11/25/2013    ALKPHOS 136 11/10/2018    ALKPHOS 247 11/25/2013    BILITOT 0.3 11/10/2018    BILITOT 0.5 11/25/2013       Xr Chest Portable    Result Date: 11/11/2018  EXAMINATION: XR CHEST PORTABLE CLINICAL HISTORY:  shortness of breath COMPARISONS: 07/25/2010 FINDINGS: Cardiac size is borderline. Pulmonary vascularity is normal. The lungs are clear. There are scattered calcifications from old granulomatous disease. There are degenerative changes of the shoulders. There is spinal fusion hardware in the lower cervical spine. There is an old healed fracture of the anterior right 6th rib.     NO ACUTE CHEST DISEASE    Lab Results   Component Value Date    IRON 17 11/10/2018    TIBC 414 11/10/2018    FERRITIN 7.0 11/10/2018     Lab Results   Component Value Date    INR 1.0 11/10/2018     No components found for: ACUTEHEPATITISSCREEN  No components found for: CELIACPANEL  No components found for: STOOLCULTURE, C.DIFF, STOOLOVAPARASITE, STOOLLEUCOCYTE        Assessment:   Iron deficiency anemia/ IDA  Previous EGD and colonoscopy reports from 09/2017 reviewed.  We will proceed with repeat evaluation with push enteroscopy and colonoscopy.  If negative initial evaluation, will proceed with capsule endoscopy.  Noted last EGD showed evidence of hiatal hernia almost 5 cm size.  Will assess for any Sheria LangCameron lesion, patient does evidence of GERD.   We will continue PPI at this time  Explained procedures, risk and benefit reviewed.  Patient would like to proceed accordingly  Left branch branch bundle block   [facility]  Associated medical conditions   Hiatal hernia, anxiety depression, dyslipidemia, thyroid  disorder    Return in about 4 weeks (around 12/10/2018) for Post procedure results discussion, further management.        Thamas JaegersHicham Jiayi Lengacher, MD

## 2018-11-26 NOTE — Telephone Encounter (Signed)
Waiting on insurance to approve Capsule Endoscopy. Faxed it on 11-13-18.

## 2018-12-02 NOTE — Telephone Encounter (Signed)
[name] called from [facility] and stated the capsule was denied by her insurance due to her having a scope done in 2018 and not having it done in 2019. [name] said you can appeal it.

## 2018-12-04 ENCOUNTER — Inpatient Hospital Stay: Payer: PRIVATE HEALTH INSURANCE

## 2018-12-04 MED ORDER — LIDOCAINE HCL 2 % IJ SOLN
2 % | INTRAMUSCULAR | Status: DC | PRN
Start: 2018-12-04 — End: 2018-12-04
  Administered 2018-12-04: 14:00:00 40 via INTRAVENOUS

## 2018-12-04 MED ORDER — ONDANSETRON HCL 4 MG/2ML IJ SOLN
4 MG/2ML | Freq: Once | INTRAMUSCULAR | Status: DC | PRN
Start: 2018-12-04 — End: 2018-12-04

## 2018-12-04 MED ORDER — NORMAL SALINE FLUSH 0.9 % IV SOLN
0.9 % | INTRAVENOUS | Status: DC | PRN
Start: 2018-12-04 — End: 2018-12-04

## 2018-12-04 MED ORDER — NORMAL SALINE FLUSH 0.9 % IV SOLN
0.9 % | Freq: Two times a day (BID) | INTRAVENOUS | Status: DC
Start: 2018-12-04 — End: 2018-12-04

## 2018-12-04 MED ORDER — SODIUM CHLORIDE 0.9 % IV SOLN
0.9 % | INTRAVENOUS | Status: DC
Start: 2018-12-04 — End: 2018-12-04
  Administered 2018-12-04: 14:00:00 via INTRAVENOUS

## 2018-12-04 MED ORDER — LIDOCAINE HCL (PF) 1 % IJ SOLN
1 % | Freq: Once | INTRAMUSCULAR | Status: DC | PRN
Start: 2018-12-04 — End: 2018-12-04

## 2018-12-04 MED ORDER — NA SULFATE-K SULFATE-MG SULF 17.5-3.13-1.6 GM/177ML PO SOLN
17.5-3.13-1.6 | PACK | Freq: Once | ORAL | 0 refills | Status: AC
Start: 2018-12-04 — End: 2018-12-04

## 2018-12-04 MED ORDER — PROPOFOL 200 MG/20ML IV EMUL
200 MG/20ML | INTRAVENOUS | Status: DC | PRN
Start: 2018-12-04 — End: 2018-12-04
  Administered 2018-12-04: 14:00:00 30 via INTRAVENOUS
  Administered 2018-12-04: 14:00:00 50 via INTRAVENOUS
  Administered 2018-12-04: 14:00:00 30 via INTRAVENOUS

## 2018-12-04 MED FILL — MONOJECT FLUSH SYRINGE 0.9 % IV SOLN: 0.9 % | INTRAVENOUS | Qty: 10

## 2018-12-04 NOTE — H&P (Signed)
Patient Name: Brooke Porter  DOB: 1956-03-19  MRN: [id number]  DATE: 12/04/18      ENDOSCOPY  History and Physical    Procedure:    []  Diagnostic Colonoscopy       []  Screening Colonoscopy  [x]  EGD      []  ERCP      []  EUS       []  Other    [x]  Previous office notes/History and Physical reviewed from the patients chart. Please see EMR for further details of HPI. I have examined the patient's status immediately prior to the procedure and:      Indications/HPI:    [] Abdominal Pain   [] Cancer- GI/Lung  [] Fhx of colon CA/polyps  [] History of Polyps   [] Barrett???s   [] Melena  [] Abnormal Imaging   [] Dysphagia    [] Persistent Pneumonia  [x] Anemia   [] Food Impaction  [] History of Polyps  [] GI Bleed   [] Pulmonary nodule/Mass  [] Change in bowel habits  [] Heartburn/Reflux  [] Rectal Bleed (BRBPR)  [] Chest Pain - Non Cardiac  [] Heme (+) Stool  [] Ulcers  [] Constipation   [] Hemoptysis   [] Varices  [] Diarrhea   [] Hypoxemia  [x] Nausea/Vomiting   [] Screening   [] Crohns/Colitis  [] Other: Anemia ( IDA)    Anesthesia:   [x]  MAC []  Moderate Sedation   []  General   []  None     ROS: 12 pt Review of Symptoms was negative unless mentioned above    Medications:   Prior to Admission medications    Medication Sig Start Date End Date Taking? Authorizing Provider   Na Sulfate-K Sulfate-Mg Sulf 17.5-3.13-1.6 GM/177ML SOLN As directed 11/26/18   Dalbert Garnet, MD   lisinopril (PRINIVIL;ZESTRIL) 10 MG tablet Take 10 mg by mouth    Historical Provider, MD   oxybutynin (DITROPAN-XL) 10 MG extended release tablet  08/24/15   Historical Provider, MD   buPROPion (WELLBUTRIN XL) 300 MG extended release tablet Take 300 mg by mouth daily 09/23/15   Historical Provider, MD   DULoxetine (CYMBALTA) 60 MG extended release capsule Take 120 mg by mouth daily 09/23/15   Historical Provider, MD   levothyroxine (SYNTHROID) 100 MCG tablet Take 100 mcg by mouth daily 02/10/17   Historical Provider, MD   ergocalciferol (ERGOCALCIFEROL) 1.25 MG (50000 UT) capsule Take  50,000 Units by mouth once a week     Historical Provider, MD   aspirin (ECOTRIN LOW STRENGTH) 81 MG EC tablet Take 1 tablet by mouth daily 11/10/18   Gunnar Bulla, MD   esomeprazole (NEXIUM) 40 MG delayed release capsule Take 1 capsule by mouth every morning (before breakfast) 11/10/18   Gunnar Bulla, MD   ALPRAZolam Duanne Moron) 0.25 MG tablet Take 1 tablet by mouth 3 times daily as needed for Anxiety for up to 30 days. 11/10/18 12/10/18  Gunnar Bulla, MD   ferrous sulfate 325 (65 Fe) MG tablet Take 1 tablet by mouth 2 times daily 11/10/18   Gunnar Bulla, MD       Allergies:   Allergies   Allergen Reactions   ??? Levofloxacin      Other reaction(s): Other: See Comments  tendinitis all over the muscles  Other reaction(s): Other: See Comments  tendinitis all over the muscles          History of allergic reaction to anesthesia:  No    Past Medical History:  Past Medical History:   Diagnosis Date   ??? Depression    ??? Hiatal hernia    ??? Hyperlipidemia    ???  Overactive bladder    ??? Social anxiety disorder    ??? Thyroid disease    ??? Vitamin D deficiency        Past Surgical History:  Past Surgical History:   Procedure Laterality Date   ??? CHOLECYSTECTOMY     ??? HYSTERECTOMY     ??? TONSILLECTOMY         Social History:  Social History     Tobacco Use   ??? Smoking status: Never Smoker   ??? Smokeless tobacco: Never Used   Substance Use Topics   ??? Alcohol use: Not Currently   ??? Drug use: Never       Vital Signs:   Vitals:    12/04/18 0907   BP: (!) 164/76   Pulse: 115   Resp: 18   Temp: 99.1 ??F (37.3 ??C)   SpO2: 96%        Physical Exam:  Cardiac:  [x] WNL  [] Comments:  Pulmonary:  [x] WNL   [] Comments:   Neuro/Mental Status:  [x] WNL  [] Comments:  Abdominal:  [x] WNL    [] Comments:  Other:   [] WNL  [] Comments:    Informed Consent:  The risks and benefits of the procedure have been discussed with either the patient or if they cannot consent, their representative.    Assessment:  Patient examined and appropriate for planned  sedation and procedure.     Plan:  Proceed with planned sedation and procedure as above.    Dalbert Garnet, MD  9:09 AM

## 2018-12-04 NOTE — Anesthesia Pre-Procedure Evaluation (Signed)
Department of Anesthesiology  Preprocedure Note       Name:  Brooke Porter   Age:  62 y.o.  DOB:  11-01-1956                                          MRN:  [id number]         Date:  12/04/2018      Surgeon: Brooke Porter):  Brooke Garnet, MD    Procedure: EGD ESOPHAGOGASTRODUODENOSCOPY (N/A )    Medications prior to admission:   Prior to Admission medications    Medication Sig Start Date End Date Taking? Authorizing Provider   Na Sulfate-K Sulfate-Mg Sulf 17.5-3.13-1.6 GM/177ML SOLN As directed 11/26/18   Brooke Garnet, MD   lisinopril (PRINIVIL;ZESTRIL) 10 MG tablet Take 10 mg by mouth    Historical Provider, MD   oxybutynin (DITROPAN-XL) 10 MG extended release tablet  08/24/15   Historical Provider, MD   buPROPion (WELLBUTRIN XL) 300 MG extended release tablet Take 300 mg by mouth daily 09/23/15   Historical Provider, MD   DULoxetine (CYMBALTA) 60 MG extended release capsule Take 120 mg by mouth daily 09/23/15   Historical Provider, MD   levothyroxine (SYNTHROID) 100 MCG tablet Take 100 mcg by mouth daily 02/10/17   Historical Provider, MD   ergocalciferol (ERGOCALCIFEROL) 1.25 MG (50000 UT) capsule Take 50,000 Units by mouth once a week     Historical Provider, MD   aspirin (ECOTRIN LOW STRENGTH) 81 MG EC tablet Take 1 tablet by mouth daily 11/10/18   Gunnar Bulla, MD   esomeprazole (NEXIUM) 40 MG delayed release capsule Take 1 capsule by mouth every morning (before breakfast) 11/10/18   Gunnar Bulla, MD   ALPRAZolam Duanne Moron) 0.25 MG tablet Take 1 tablet by mouth 3 times daily as needed for Anxiety for up to 30 days. 11/10/18 12/10/18  Gunnar Bulla, MD   ferrous sulfate 325 (65 Fe) MG tablet Take 1 tablet by mouth 2 times daily 11/10/18   Gunnar Bulla, MD       Current medications:    Current Facility-Administered Medications   Medication Dose Route Frequency Provider Last Rate Last Dose   ??? 0.9 % sodium chloride infusion   Intravenous Continuous Hicham Khallafi, MD       ??? sodium chloride flush 0.9 %  injection 10 mL  10 mL Intravenous 2 times per day Brooke Garnet, MD       ??? sodium chloride flush 0.9 % injection 10 mL  10 mL Intravenous PRN Brooke Garnet, MD       ??? lidocaine PF 1 % injection 1 mL  1 mL Intradermal Once PRN Hicham Khallafi, MD       ??? ondansetron (ZOFRAN) injection 4 mg  4 mg Intravenous Once PRN Brooke Garnet, MD           Allergies:    Allergies   Allergen Reactions   ??? Levofloxacin      Other reaction(s): Other: See Comments  tendinitis all over the muscles  Other reaction(s): Other: See Comments  tendinitis all over the muscles         Problem List:  There is no problem list on file for this patient.      Past Medical History:        Diagnosis Date   ??? Depression    ??? Hiatal hernia    ???  Hyperlipidemia    ??? Overactive bladder    ??? Social anxiety disorder    ??? Thyroid disease    ??? Vitamin D deficiency        Past Surgical History:        Procedure Laterality Date   ??? CHOLECYSTECTOMY     ??? HYSTERECTOMY     ??? TONSILLECTOMY         Social History:    Social History     Tobacco Use   ??? Smoking status: Never Smoker   ??? Smokeless tobacco: Never Used   Substance Use Topics   ??? Alcohol use: Not Currently                                Counseling given: Not Answered      Vital Signs (Current):   Vitals:    12/04/18 0907   BP: (!) 164/76   Pulse: 115   Resp: 18   Temp: 37.3 ??C (99.1 ??F)   TempSrc: Temporal   SpO2: 96%                                              BP Readings from Last 3 Encounters:   12/04/18 (!) 164/76   11/26/18 122/72   11/12/18 136/62       NPO Status: Time of last liquid consumption: 0400                        Time of last solid consumption: 1630                        Date of last liquid consumption: 12/04/18                        Date of last solid food consumption: 12/03/18(hamberger last night for dinner)    BMI:   Wt Readings from Last 3 Encounters:   11/26/18 225 lb (102.1 kg)   11/12/18 232 lb (105.2 kg)   11/10/18 228 lb (103.4 kg)     There is no height or weight  on file to calculate BMI.    CBC:   Lab Results   Component Value Date    WBC 11.0 11/10/2018    RBC 4.12 11/10/2018    HGB 8.7 11/10/2018    HCT 28.6 11/10/2018    MCV 69.4 11/10/2018    RDW 21.6 11/10/2018    PLT 522 11/10/2018       CMP:   Lab Results   Component Value Date    NA 142 11/10/2018    K 4.7 11/10/2018    CL 107 11/10/2018    CO2 22 11/10/2018    BUN 20 11/10/2018    CREATININE 1.07 11/10/2018    GFRAA >60.0 11/10/2018    LABGLOM 51.7 11/10/2018    GLUCOSE 177 11/10/2018    PROT 8.2 11/10/2018    CALCIUM 9.9 11/10/2018    BILITOT 0.3 11/10/2018    ALKPHOS 136 11/10/2018    AST 26 11/10/2018    ALT 29 11/10/2018       POC Tests: No results for input(s): POCGLU, POCNA, POCK, POCCL, POCBUN, POCHEMO, POCHCT in the last 72 hours.    Coags:   Lab  Results   Component Value Date    PROTIME 13.5 11/10/2018    INR 1.0 11/10/2018    APTT 24.7 11/10/2018       HCG (If Applicable): No results found for: PREGTESTUR, PREGSERUM, HCG, HCGQUANT     ABGs: No results found for: PHART, PO2ART, PCO2ART, HCO3ART, BEART, O2SATART     Type & Screen (If Applicable):  No results found for: LABABO, [place]    Anesthesia Evaluation  Patient summary reviewed and Nursing notes reviewed  Airway: Mallampati: II  TM distance: >3 FB   Neck ROM: full  Mouth opening: > = 3 FB Dental: normal exam         Pulmonary:Negative Pulmonary ROS and normal exam              Patient did not smoke on day of surgery.                 Cardiovascular:    (+) hypertension:, hyperlipidemia         Beta Blocker:  Not on Beta Blocker         Neuro/Psych:   (+) psychiatric history:depression/anxiety             GI/Hepatic/Renal:   (+) hiatal hernia,           Endo/Other:    (+) hypothyroidism::., .          Pt had no PAT visit        ROS comment: anemia Abdominal:   (+) obese,     Abdomen: soft.    Vascular: negative vascular ROS.                                     Anesthesia Plan      MAC     ASA 3       Induction: intravenous.      Anesthetic plan and risks  discussed with patient.      Plan discussed with CRNA.                  Majel Homer, APRN - CRNA   12/04/2018

## 2018-12-04 NOTE — Anesthesia Post-Procedure Evaluation (Signed)
Department of Anesthesiology  Postprocedure Note    Patient: Brooke Porter  MRN: [id number]  Birthdate: [birth year]  Date of evaluation: 12/04/2018  Time:  9:35 AM     Procedure Summary     Date:  12/04/18 Room / Location:  [facility] OR 01 / [facility]    Anesthesia Start:  0919 Anesthesia Stop:  1017    Procedure:  EGD ESOPHAGOGASTRODUODENOSCOPY (N/A ) Diagnosis:  (Iron deficiency anemia D50.9   (CPT 51025, 85277))    Surgeon:  Dalbert Garnet, MD Responsible Provider:  Majel Homer, APRN - CRNA    Anesthesia Type:  MAC ASA Status:  3          Anesthesia Type: MAC    Aldrete Phase I: Aldrete Score: 10    Aldrete Phase II:      Last vitals: Reviewed and per EMR flowsheets.       Anesthesia Post Evaluation    Patient location during evaluation: PACU  Patient participation: complete - patient participated  Level of consciousness: awake and alert  Pain score: 0  Airway patency: patent  Nausea & Vomiting: no nausea and no vomiting  Complications: no  Cardiovascular status: blood pressure returned to baseline and hemodynamically stable  Respiratory status: acceptable  Hydration status: euvolemic

## 2018-12-05 ENCOUNTER — Encounter

## 2018-12-12 ENCOUNTER — Ambulatory Visit: Admit: 2018-12-12 | Discharge: 2018-12-12 | Payer: PRIVATE HEALTH INSURANCE | Attending: Gastroenterology

## 2018-12-12 ENCOUNTER — Telehealth

## 2018-12-12 ENCOUNTER — Encounter

## 2018-12-12 ENCOUNTER — Encounter: Attending: Gastroenterology

## 2018-12-12 DIAGNOSIS — D509 Iron deficiency anemia, unspecified: Secondary | ICD-10-CM

## 2018-12-12 LAB — CBC
Hematocrit: 30.5 % — ABNORMAL LOW (ref 37.0–47.0)
Hemoglobin: 9.2 g/dL — ABNORMAL LOW (ref 12.0–16.0)
MCH: 22.2 pg — ABNORMAL LOW (ref 27.0–31.3)
MCHC: 30.1 % — ABNORMAL LOW (ref 33.0–37.0)
MCV: 73.7 fL — ABNORMAL LOW (ref 82.0–100.0)
Platelets: 348 10*3/uL (ref 130–400)
RBC: 4.14 M/uL — ABNORMAL LOW (ref 4.20–5.40)
RDW: 25.3 % — ABNORMAL HIGH (ref 11.5–14.5)
WBC: 5.9 10*3/uL (ref 4.8–10.8)

## 2018-12-12 NOTE — Progress Notes (Signed)
Subjective:      Patient ID: Brooke Porter is a 62 y.o. female who presents today for:  Chief Complaint   Patient presents with   ??? Follow-up     follow up EGD       HPI  This is a very pleasant 62 year old that came in today for follow-up visit.  Noted patient with    Today visit :  Patient came in today for follow-up visit, since the last visit had an EGD that showed 6 cm hiatal hernia as well as LA grade B esophagitis.  Colonoscopy was not performed owing to poor prep.  Of note patient was supposed to have a capsule endoscopy as had EGD colonoscopy in 2018 however that was declined by the insurance.  Recommend repeat colonoscopy within 1 year.  Advised patient is been on iron supplement for iron deficiency anemia however still persistently iron deficient.  No overt bleeding reported.  No hematemesis, melena or hematochezia.  Note 11/26/2018:  Patient came in today for follow-up visit.  Since last seen, patient had cardiac catheterization that was negative for coronary artery disease.  Patient received IV iron during recent hospital stay at [facility].  And came in today for follow-up and future plan of care.  No overt bleeding reported in terms of hematemesis melena hematochezia.  Background  This is a very pleasant 62 year old who came in today for further evaluation and management of iron deficiency anemia. ??Patient denies any overt bleeding in term of hematemesis hematochezia, or black stool/melena. ??She reported that she has been having fatigue and tiredness over the last year or two. ??She does report exertional dyspnea. ??Patient mentioned that she had the EGD and colonoscopy at outside facility within 1 year or almost 1 year back. ??And was negative except for a hiatal hernia. ??At the time of the EGD and??colonoscopy patient was reporting exertional shortness of breath. ??Current hemoglobin is 8.7 from 9.4 in 03/12/2018. ??Noted hemoglobin 11.8 in 2018 down from 13.8 in 2016.????Otherwise patient has iron studies  and with evidence of ??iron deficiency anemia. ??Denies NSAID use.????No anticoagulants reported. ??Patient mentioned that she had sent EKG that shows left branch bundle block and currently has been followed with cardiology was told to have normal EKG prior to that.????She came in today for initial visit  Past Medical History:   Diagnosis Date   ??? Depression    ??? Hiatal hernia    ??? Hyperlipidemia    ??? Hypertension    ??? Overactive bladder    ??? Social anxiety disorder    ??? Thyroid disease    ??? Vitamin D deficiency      Past Surgical History:   Procedure Laterality Date   ??? CHOLECYSTECTOMY     ??? COLONOSCOPY     ??? ENDOSCOPY, COLON, DIAGNOSTIC     ??? HYSTERECTOMY     ??? TONSILLECTOMY     ??? UPPER GASTROINTESTINAL ENDOSCOPY N/A 12/04/2018    EGD ESOPHAGOGASTRODUODENOSCOPY performed by Thamas Jaegers, MD at [facility]     Social History     Socioeconomic History   ??? Marital status: Single     Spouse name: Not on file   ??? Number of children: Not on file   ??? Years of education: Not on file   ??? Highest education level: Not on file   Occupational History   ??? Not on file   Social Needs   ??? Financial resource strain: Not on file   ??? Food insecurity:  Worry: Not on file     Inability: Not on file   ??? Transportation needs:     Medical: Not on file     Non-medical: Not on file   Tobacco Use   ??? Smoking status: Never Smoker   ??? Smokeless tobacco: Never Used   Substance and Sexual Activity   ??? Alcohol use: Not Currently   ??? Drug use: Never   ??? Sexual activity: Not on file   Lifestyle   ??? Physical activity:     Days per week: Not on file     Minutes per session: Not on file   ??? Stress: Not on file   Relationships   ??? Social connections:     Talks on phone: Not on file     Gets together: Not on file     Attends religious service: Not on file     Active member of club or organization: Not on file     Attends meetings of clubs or organizations: Not on file     Relationship status: Not on file   ??? Intimate partner violence:     Fear of  current or ex partner: Not on file     Emotionally abused: Not on file     Physically abused: Not on file     Forced sexual activity: Not on file   Other Topics Concern   ??? Not on file   Social History Narrative   ??? Not on file     Family History   Problem Relation Age of Onset   ??? Celiac Disease Neg Hx    ??? Colon Cancer Neg Hx    ??? Crohn's Disease Neg Hx      Allergies   Allergen Reactions   ??? Levofloxacin      Other reaction(s): Other: See Comments  tendinitis all over the muscles  Other reaction(s): Other: See Comments  tendinitis all over the muscles           Review of Systems   Constitutional: Negative for appetite change, chills, fatigue, fever and unexpected weight change.   HENT: Negative for nosebleeds, tinnitus, trouble swallowing and voice change.    Eyes: Negative for photophobia, pain and redness.   Respiratory: Negative for chest tightness, shortness of breath and wheezing.    Cardiovascular: Negative for chest pain, palpitations and leg swelling.   Gastrointestinal: Negative for abdominal distention, abdominal pain, blood in stool, constipation, diarrhea, nausea, rectal pain and vomiting.   Endocrine: Negative for polydipsia, polyphagia and polyuria.   Genitourinary: Negative for difficulty urinating and hematuria.   Skin: Negative for color change, pallor and rash.   Neurological: Negative for dizziness, speech difficulty and headaches.   Psychiatric/Behavioral: Negative for confusion and suicidal ideas.       Objective:   BP 120/80 (Site: Left Upper Arm, Position: Sitting, Cuff Size: Medium Adult)    Pulse 86    Ht 5\' 8"  (1.727 m)    Wt 230 lb (104.3 kg)    SpO2 97%    BMI 34.97 kg/m??     Physical Exam  Constitutional:       General: She is not in acute distress.     Appearance: She is well-developed.   HENT:      Head: Normocephalic and atraumatic.   Eyes:      Conjunctiva/sclera: Conjunctivae normal.      Pupils: Pupils are equal, round, and reactive to light.   Neck:      Musculoskeletal:  Neck  supple.   Cardiovascular:      Rate and Rhythm: Normal rate and regular rhythm.      Heart sounds: Normal heart sounds.   Pulmonary:      Effort: Pulmonary effort is normal. No respiratory distress.      Breath sounds: Normal breath sounds. No wheezing or rales.   Abdominal:      General: Bowel sounds are normal. There is no distension.      Palpations: Abdomen is soft. Abdomen is not rigid. There is no hepatomegaly, splenomegaly or mass.      Tenderness: There is no tenderness. There is no guarding or rebound.   Musculoskeletal: Normal range of motion.         General: No tenderness or deformity.   Skin:     Coloration: Skin is not pale.      Findings: No erythema or rash.   Neurological:      Mental Status: She is alert and oriented to person, place, and time.         Laboratory, Pathology, Radiology reviewed in detail with relevantimportant investigations summarized below:  Lab Results   Component Value Date    WBC 11.0 11/10/2018    HGB 8.7 11/10/2018    HCT 28.6 11/10/2018    MCV 69.4 11/10/2018    PLT 522 11/10/2018    .  Lab Results   Component Value Date    ALT 29 11/10/2018    ALT 321 11/25/2013    AST 26 11/10/2018    AST 115 11/25/2013    ALKPHOS 136 11/10/2018    ALKPHOS 247 11/25/2013    BILITOT 0.3 11/10/2018    BILITOT 0.5 11/25/2013       No results found.  Lab Results   Component Value Date    IRON 17 11/10/2018    TIBC 414 11/10/2018    FERRITIN 7.0 11/10/2018     Lab Results   Component Value Date    INR 1.0 11/10/2018     No components found for: ACUTEHEPATITISSCREEN  No components found for: CELIACPANEL  No components found for: STOOLCULTURE, C.DIFF, STOOLOVAPARASITE, STOOLLEUCOCYTE        Assessment:       Diagnosis Orders   1. Iron deficiency anemia, unspecified iron deficiency anemia type  CT ENTEROGRAPHY W WO CONTRAST    [facility] - Sidloski, Vonna Kotyk, DO, Oncology, [place]    CBC    Celiac Disease Panel         Plan:      Orders Placed This Encounter   Procedures   ??? CT ENTEROGRAPHY W WO CONTRAST      Standing Status:   Future     Standing Expiration Date:   12/13/2019     Order Specific Question:   Reason for exam:     Answer:   ida   ??? CBC     Standing Status:   Future     Standing Expiration Date:   12/13/2019   ??? Celiac Disease Panel     Standing Status:   Future     Standing Expiration Date:   12/13/2019   ??? [facility] - Acey Lav, Oncology, [facility]     Referral Priority:   Routine     Referral Type:   Eval and Treat     Referral Reason:   Specialty Services Required     Referred to Provider:   Ramond Craver, DO     Requested Specialty:   Hematology  and Oncology     Number of Visits Requested:   1     No orders of the defined types were placed in this encounter.  1- Iron deficiency anemia/ IDA  Previous EGD and colonoscopy reports from 09/2017 reviewed were negative for any source of iron deficiency anemia..  Insurance company did not approve capsule endoscopy and was recommended to have repeat colonoscopy in 1 year per [employer] policies.  Had an EGD that showed 6 cm hiatal hernia and LA grade B esophagitis no Cameron's lesions identified.  Patient had poor prep and is due and scheduled for colonoscopy next week.  Noted persistent iron deficiency despite oral supplements.  We will proceed with CT enterography.  Discussed with primary care , will proceed with IV iron as did not respond to oral iron supplementation  Explained plan of care. Patient would like to proceed accordingly  2- Left branch branch bundle block   [facility]  3- Associated medical conditions  ??Hiatal hernia, anxiety depression,??dyslipidemia, thyroid disorder    Return for Post procedure results discussion, further management.      Thamas JaegersHicham Dondi Aime, MD

## 2018-12-12 NOTE — Telephone Encounter (Signed)
Treatment plan, pt has failed oral iron supplements, please see progress not from 12/12/18

## 2018-12-16 LAB — CELIAC DISEASE PANEL: Celiac Panel: 8 Units (ref 0–19)

## 2018-12-20 ENCOUNTER — Inpatient Hospital Stay: Payer: PRIVATE HEALTH INSURANCE

## 2018-12-20 MED ORDER — NORMAL SALINE FLUSH 0.9 % IV SOLN
0.9 % | INTRAVENOUS | Status: DC | PRN
Start: 2018-12-20 — End: 2018-12-20

## 2018-12-20 MED ORDER — LIDOCAINE HCL 2 % IJ SOLN
2 % | INTRAMUSCULAR | Status: DC | PRN
Start: 2018-12-20 — End: 2018-12-20
  Administered 2018-12-20: 15:00:00 50 via INTRAVENOUS

## 2018-12-20 MED ORDER — SODIUM CHLORIDE 0.9 % IV SOLN
0.9 % | INTRAVENOUS | Status: DC
Start: 2018-12-20 — End: 2018-12-20
  Administered 2018-12-20: 15:00:00 via INTRAVENOUS

## 2018-12-20 MED ORDER — SODIUM CHLORIDE 0.9 % IV SOLN
0.9 % | INTRAVENOUS | Status: DC | PRN
Start: 2018-12-20 — End: 2018-12-20
  Administered 2018-12-20: 15:00:00 via INTRAVENOUS

## 2018-12-20 MED ORDER — NORMAL SALINE FLUSH 0.9 % IV SOLN
0.9 % | Freq: Two times a day (BID) | INTRAVENOUS | Status: DC
Start: 2018-12-20 — End: 2018-12-20

## 2018-12-20 MED ORDER — LIDOCAINE HCL (PF) 1 % IJ SOLN
1 % | Freq: Once | INTRAMUSCULAR | Status: DC | PRN
Start: 2018-12-20 — End: 2018-12-20

## 2018-12-20 MED ORDER — PROPOFOL 200 MG/20ML IV EMUL
200 MG/20ML | INTRAVENOUS | Status: DC | PRN
Start: 2018-12-20 — End: 2018-12-20
  Administered 2018-12-20: 15:00:00 250 via INTRAVENOUS
  Administered 2018-12-20: 15:00:00 100 via INTRAVENOUS

## 2018-12-20 MED ORDER — ONDANSETRON HCL 4 MG/2ML IJ SOLN
4 MG/2ML | Freq: Once | INTRAMUSCULAR | Status: AC | PRN
Start: 2018-12-20 — End: 2018-12-20
  Administered 2018-12-20: 16:00:00 4 mg via INTRAVENOUS

## 2018-12-20 MED FILL — MONOJECT FLUSH SYRINGE 0.9 % IV SOLN: 0.9 % | INTRAVENOUS | Qty: 10

## 2018-12-20 NOTE — H&P (Signed)
Patient Name: Brooke Porter  DOB: 1956-04-28  MRN: [id number]  DATE: 12/20/18      ENDOSCOPY  History and Physical    Procedure:    _0  Diagnostic Colonoscopy       _1  Screening Colonoscopy  _2  EGD      _3  ERCP      _4  EUS       _5  Other    _6  Previous office notes/History and Physical reviewed from the patients chart. Please see EMR for further details of HPI. I have examined the patient's status immediately prior to the procedure and:      Indications/HPI:    _7 Abdominal Pain   _8 Cancer- GI/Lung  _9 Fhx of colon CA/polyps  _10 History of Polyps   _11 Barrett???s   _12 Melena  _13 Abnormal Imaging   _14 Dysphagia    _15 Persistent Pneumonia  _16 Anemia   _17 Food Impaction  _18 History of Polyps  _19 GI Bleed   _20 Pulmonary nodule/Mass  _21 Change in bowel habits  _22 Heartburn/Reflux  _23 Rectal Bleed (BRBPR)  _24 Chest Pain - Non Cardiac  _25 Heme (+) Stool  _26 Ulcers  _27 Constipation   _28 Hemoptysis   _29 Varices  _30 Diarrhea   _31 Hypoxemia  _32 Nausea/Vomiting   _33 Screening   _34 Crohns/Colitis  _35 Other:    Anesthesia:   _36  MAC _37  Moderate Sedation   _38  General   _39  None     ROS: 12 pt Review of Symptoms was negative unless mentioned above    Medications:   Prior to Admission medications    Medication Sig Start Date End Date Taking? Authorizing Provider   ARIPiprazole (ABILIFY) 2 MG tablet  11/29/18  Yes Historical Provider, MD   lisinopril (PRINIVIL;ZESTRIL) 10 MG tablet Take 10 mg by mouth   Yes Historical Provider, MD   oxybutynin (DITROPAN-XL) 10 MG extended release tablet  08/24/15  Yes Historical Provider, MD   buPROPion (WELLBUTRIN XL) 300 MG extended release tablet Take 300 mg by mouth daily 09/23/15  Yes Historical Provider, MD   DULoxetine (CYMBALTA) 60 MG extended release capsule Take 120 mg by mouth daily 09/23/15  Yes Historical Provider, MD   levothyroxine (SYNTHROID) 100 MCG tablet Take 100 mcg by mouth daily 02/10/17  Yes Historical Provider, MD   ergocalciferol (ERGOCALCIFEROL) 1.25 MG (50000 UT) capsule Take 50,000 Units by mouth once a  week    Yes Historical Provider, MD   esomeprazole (NEXIUM) 40 MG delayed release capsule Take 1 capsule by mouth every morning (before breakfast) 11/10/18  Yes Gunnar Bulla, MD   Na Sulfate-K Sulfate-Mg Sulf 17.5-3.13-1.6 GM/177ML SOLN As directed  Patient not taking: Reported on 12/12/2018 11/26/18   Dalbert Garnet, MD   aspirin (ECOTRIN LOW STRENGTH) 81 MG EC tablet Take 1 tablet by mouth daily 11/10/18   Gunnar Bulla, MD   ferrous sulfate 325 (65 Fe) MG tablet Take 1 tablet by mouth 2 times daily 11/10/18   Gunnar Bulla, MD       Allergies:   Allergies   Allergen Reactions   ??? Levofloxacin      Other reaction(s): Other: See Comments  tendinitis all over the muscles  Other reaction(s): Other: See Comments  tendinitis all over the muscles          History of allergic reaction to anesthesia:  No    Past Medical History:  Past Medical History:   Diagnosis Date   ??? Depression    ??? Hiatal hernia    ??? Hyperlipidemia    ??? Hypertension    ??? Overactive bladder    ???  Social anxiety disorder    ??? Thyroid disease    ??? Vitamin D deficiency        Past Surgical History:  Past Surgical History:   Procedure Laterality Date   ??? CHOLECYSTECTOMY     ??? COLONOSCOPY     ??? ENDOSCOPY, COLON, DIAGNOSTIC     ??? HYSTERECTOMY     ??? TONSILLECTOMY     ??? UPPER GASTROINTESTINAL ENDOSCOPY N/A 12/04/2018    EGD ESOPHAGOGASTRODUODENOSCOPY performed by Dalbert Garnet, MD at [place] History:  Social History     Tobacco Use   ??? Smoking status: Never Smoker   ??? Smokeless tobacco: Never Used   Substance Use Topics   ??? Alcohol use: Not Currently   ??? Drug use: Never       Vital Signs:   Vitals:    12/20/18 0923   BP: 131/86   Pulse: 104   Resp: 18   Temp: 97.4 ??F (36.3 ??C)   SpO2: 95%        Physical Exam:  Cardiac:  _0 WNL  _1 Comments:  Pulmonary:  _2 WNL   _3 Comments:   Neuro/Mental Status:  _4 WNL  _5 Comments:  Abdominal:  _6 WNL    _7 Comments:  Other:   _8 WNL  _9 Comments:    Informed Consent:  The risks and benefits  of the procedure have been discussed with either the patient or if they cannot consent, their representative.    Assessment:  Patient examined and appropriate for planned sedation and procedure.     Plan:  Proceed with planned sedation and procedure as above.    Dalbert Garnet, MD  9:50 AM

## 2018-12-20 NOTE — Anesthesia Post-Procedure Evaluation (Signed)
Department of Anesthesiology  Postprocedure Note    Patient: Brooke Porter  MRN: [id number]  Birthdate: Dec 22, 1956  Date of evaluation: 12/20/2018  Time:  10:33 AM     Procedure Summary     Date:  12/20/18 Room / Location:  [facility] OR 01 / [facility]    Anesthesia Start:  [phone] Anesthesia Stop:      Procedure:  COLONOSCOPY DIAGNOSTIC (N/A ) Diagnosis:  (16967 - Iron deficiency anemai)    Surgeon:  Dalbert Garnet, MD Responsible Provider:  Moshe Cipro, APRN - CRNA    Anesthesia Type:  MAC ASA Status:  2          Anesthesia Type: MAC    Aldrete Phase I: Aldrete Score: 10    Aldrete Phase II:      Last vitals: Reviewed and per EMR flowsheets.       Anesthesia Post Evaluation    Patient location during evaluation: bedside  Patient participation: complete - patient participated  Level of consciousness: awake  Airway patency: patent  Nausea & Vomiting: no nausea and no vomiting  Complications: no  Cardiovascular status: blood pressure returned to baseline and hemodynamically stable  Respiratory status: acceptable and nasal cannula  Hydration status: euvolemic

## 2018-12-20 NOTE — Anesthesia Pre-Procedure Evaluation (Signed)
Department of Anesthesiology  Preprocedure Note       Name:  Brooke Porter   Age:  62 y.o.  DOB:  Jan 09, 1956                                          MRN:  [id number]         Date:  12/20/2018      Surgeon: Moishe Spice):  Thamas Jaegers, MD    Procedure: COLONOSCOPY DIAGNOSTIC (N/A )    Medications prior to admission:   Prior to Admission medications    Medication Sig Start Date End Date Taking? Authorizing Provider   ARIPiprazole (ABILIFY) 2 MG tablet  11/29/18  Yes Historical Provider, MD   lisinopril (PRINIVIL;ZESTRIL) 10 MG tablet Take 10 mg by mouth   Yes Historical Provider, MD   oxybutynin (DITROPAN-XL) 10 MG extended release tablet  08/24/15  Yes Historical Provider, MD   buPROPion (WELLBUTRIN XL) 300 MG extended release tablet Take 300 mg by mouth daily 09/23/15  Yes Historical Provider, MD   DULoxetine (CYMBALTA) 60 MG extended release capsule Take 120 mg by mouth daily 09/23/15  Yes Historical Provider, MD   levothyroxine (SYNTHROID) 100 MCG tablet Take 100 mcg by mouth daily 02/10/17  Yes Historical Provider, MD   ergocalciferol (ERGOCALCIFEROL) 1.25 MG (50000 UT) capsule Take 50,000 Units by mouth once a week    Yes Historical Provider, MD   esomeprazole (NEXIUM) 40 MG delayed release capsule Take 1 capsule by mouth every morning (before breakfast) 11/10/18  Yes Magdalene Molly, MD   Na Sulfate-K Sulfate-Mg Sulf 17.5-3.13-1.6 GM/177ML SOLN As directed  Patient not taking: Reported on 12/12/2018 11/26/18   Thamas Jaegers, MD   aspirin (ECOTRIN LOW STRENGTH) 81 MG EC tablet Take 1 tablet by mouth daily 11/10/18   Magdalene Molly, MD   ferrous sulfate 325 (65 Fe) MG tablet Take 1 tablet by mouth 2 times daily 11/10/18   Magdalene Molly, MD       Current medications:    Current Facility-Administered Medications   Medication Dose Route Frequency Provider Last Rate Last Dose   . 0.9 % sodium chloride infusion   Intravenous Continuous Hicham Khallafi, MD       . sodium chloride flush 0.9 % injection 10 mL  10 mL  Intravenous 2 times per day Thamas Jaegers, MD       . sodium chloride flush 0.9 % injection 10 mL  10 mL Intravenous PRN Hicham Khallafi, MD       . lidocaine PF 1 % injection 1 mL  1 mL Intradermal Once PRN Hicham Khallafi, MD       . ondansetron (ZOFRAN) injection 4 mg  4 mg Intravenous Once PRN Thamas Jaegers, MD           Allergies:    Allergies   Allergen Reactions   . Levofloxacin      Other reaction(s): Other: See Comments  tendinitis all over the muscles  Other reaction(s): Other: See Comments  tendinitis all over the muscles         Problem List:    Patient Active Problem List   Diagnosis Code   . Hiatal hernia K44.9   . Esophagitis K20.9   . IDA (iron deficiency anemia) D50.9       Past Medical History:        Diagnosis Date   .  Depression    . Hiatal hernia    . Hyperlipidemia    . Hypertension    . Overactive bladder    . Social anxiety disorder    . Thyroid disease    . Vitamin D deficiency        Past Surgical History:        Procedure Laterality Date   . CHOLECYSTECTOMY     . COLONOSCOPY     . ENDOSCOPY, COLON, DIAGNOSTIC     . HYSTERECTOMY     . TONSILLECTOMY     . UPPER GASTROINTESTINAL ENDOSCOPY N/A 12/04/2018    EGD ESOPHAGOGASTRODUODENOSCOPY performed by Thamas Jaegers, MD at [facility]       Social History:    Social History     Tobacco Use   . Smoking status: Never Smoker   . Smokeless tobacco: Never Used   Substance Use Topics   . Alcohol use: Not Currently                                Counseling given: Not Answered      Vital Signs (Current):   Vitals:    12/20/18 0923   BP: 131/86   Pulse: 104   Resp: 18   Temp: 36.3 C (97.4 F)   SpO2: 95%   Weight: 228 lb (103.4 kg)   Height: 5\' 8"  (1.727 m)                                              BP Readings from Last 3 Encounters:   12/20/18 131/86   12/12/18 120/80   12/04/18 106/69       NPO Status:                                                                                 BMI:   Wt Readings from Last 3 Encounters:   12/20/18  228 lb (103.4 kg)   12/12/18 230 lb (104.3 kg)   11/26/18 225 lb (102.1 kg)     Body mass index is 34.67 kg/m.    CBC:   Lab Results   Component Value Date    WBC 5.9 12/12/2018    RBC 4.14 12/12/2018    HGB 9.2 12/12/2018    HCT 30.5 12/12/2018    MCV 73.7 12/12/2018    RDW 25.3 12/12/2018    PLT 348 12/12/2018       CMP:   Lab Results   Component Value Date    NA 142 11/10/2018    K 4.7 11/10/2018    CL 107 11/10/2018    CO2 22 11/10/2018    BUN 20 11/10/2018    CREATININE 1.07 11/10/2018    GFRAA >60.0 11/10/2018    LABGLOM 51.7 11/10/2018    GLUCOSE 177 11/10/2018    PROT 8.2 11/10/2018    CALCIUM 9.9 11/10/2018    BILITOT 0.3 11/10/2018    ALKPHOS 136 11/10/2018    AST 26 11/10/2018  ALT 29 11/10/2018       POC Tests: No results for input(s): POCGLU, POCNA, POCK, POCCL, POCBUN, POCHEMO, POCHCT in the last 72 hours.    Coags:   Lab Results   Component Value Date    PROTIME 13.5 11/10/2018    INR 1.0 11/10/2018    APTT 24.7 11/10/2018       HCG (If Applicable): No results found for: PREGTESTUR, PREGSERUM, HCG, HCGQUANT     ABGs: No results found for: PHART, PO2ART, PCO2ART, HCO3ART, BEART, O2SATART     Type & Screen (If Applicable):  No results found for: LABABO, [facility]    Anesthesia Evaluation  Patient summary reviewed and Nursing notes reviewed history of anesthetic complications:   Airway: Mallampati: II  TM distance: >3 FB   Neck ROM: full  Mouth opening: > = 3 FB Dental:          Pulmonary:normal exam                               Cardiovascular:    (+) hypertension:,                   Neuro/Psych:   (+) psychiatric history:            GI/Hepatic/Renal:   (+) hiatal hernia, bowel prep,          ROS comment: anemia.   Endo/Other: Negative Endo/Other ROS                    Abdominal:   (+) obese,         Vascular:                                        Anesthesia Plan      MAC     ASA 2       Induction: intravenous.      Anesthetic plan and risks discussed with patient.      Plan discussed with  attending.                  Kizzie Bane, APRN - CRNA   12/20/2018

## 2018-12-21 ENCOUNTER — Inpatient Hospital Stay: Payer: PRIVATE HEALTH INSURANCE

## 2018-12-21 DIAGNOSIS — D509 Iron deficiency anemia, unspecified: Secondary | ICD-10-CM

## 2018-12-21 MED ORDER — BARIUM SULFATE 0.1 % PO SUSP
0.1 % | Freq: Once | ORAL | Status: DC | PRN
Start: 2018-12-21 — End: 2018-12-24

## 2018-12-21 MED ORDER — NORMAL SALINE FLUSH 0.9 % IV SOLN
0.9 % | Freq: Once | INTRAVENOUS | Status: AC | PRN
Start: 2018-12-21 — End: 2018-12-21

## 2018-12-21 MED ORDER — IOPAMIDOL 76 % IV SOLN
76 % | Freq: Once | INTRAVENOUS | Status: DC | PRN
Start: 2018-12-21 — End: 2018-12-24

## 2018-12-21 MED FILL — MONOJECT FLUSH SYRINGE 0.9 % IV SOLN: 0.9 % | INTRAVENOUS | Qty: 10

## 2018-12-23 ENCOUNTER — Encounter

## 2018-12-24 ENCOUNTER — Inpatient Hospital Stay: Admit: 2018-12-24 | Payer: PRIVATE HEALTH INSURANCE

## 2018-12-24 DIAGNOSIS — D508 Other iron deficiency anemias: Secondary | ICD-10-CM

## 2018-12-24 MED ORDER — SODIUM CHLORIDE 0.9 % IV SOLN
0.9 % | INTRAVENOUS | Status: AC
Start: 2018-12-24 — End: 2018-12-24

## 2018-12-24 MED ORDER — FERRIC CARBOXYMALTOSE 750 MG/15ML IV SOLN
750 MG/15ML | Freq: Once | INTRAVENOUS | Status: AC
Start: 2018-12-24 — End: 2018-12-24
  Administered 2018-12-24: 15:00:00 750 mg via INTRAVENOUS

## 2018-12-24 MED FILL — INJECTAFER 750 MG/15ML IV SOLN: 750 MG/15ML | INTRAVENOUS | Qty: 15

## 2018-12-24 NOTE — Other (Signed)
Infusion complete. Patient tolerated well.

## 2018-12-24 NOTE — Other (Signed)
Infusion running, patient tolerating well

## 2018-12-24 NOTE — Other (Signed)
Patient arrived, alert, oriented, resting in chair.

## 2018-12-24 NOTE — Other (Signed)
Patient ambulated off unit. All equipment cleaned after discharge

## 2018-12-27 ENCOUNTER — Encounter

## 2018-12-27 ENCOUNTER — Inpatient Hospital Stay: Admit: 2018-12-27 | Payer: PRIVATE HEALTH INSURANCE

## 2018-12-27 DIAGNOSIS — D509 Iron deficiency anemia, unspecified: Secondary | ICD-10-CM

## 2018-12-27 MED ORDER — NORMAL SALINE FLUSH 0.9 % IV SOLN
0.9 % | Freq: Once | INTRAVENOUS | Status: AC | PRN
Start: 2018-12-27 — End: 2018-12-27

## 2018-12-27 MED ORDER — BARIUM SULFATE 0.1 % PO SUSP
0.1 % | Freq: Once | ORAL | Status: AC | PRN
Start: 2018-12-27 — End: 2018-12-27
  Administered 2018-12-27: 15:00:00 450 mL via ORAL

## 2018-12-27 MED ORDER — IOPAMIDOL 76 % IV SOLN
76 % | Freq: Once | INTRAVENOUS | Status: AC | PRN
Start: 2018-12-27 — End: 2018-12-27
  Administered 2018-12-27: 15:00:00 100 mL via INTRAVENOUS

## 2018-12-27 MED FILL — MONOJECT FLUSH SYRINGE 0.9 % IV SOLN: 0.9 % | INTRAVENOUS | Qty: 10

## 2019-01-01 ENCOUNTER — Inpatient Hospital Stay: Admit: 2019-01-01 | Payer: PRIVATE HEALTH INSURANCE

## 2019-01-01 DIAGNOSIS — D508 Other iron deficiency anemias: Secondary | ICD-10-CM

## 2019-01-01 MED ORDER — FERRIC CARBOXYMALTOSE 750 MG/15ML IV SOLN
75015 MG/15ML | Freq: Once | INTRAVENOUS | Status: AC
Start: 2019-01-01 — End: 2019-01-01
  Administered 2019-01-01: 17:00:00 750 mg via INTRAVENOUS

## 2019-01-01 MED FILL — INJECTAFER 750 MG/15ML IV SOLN: 750 MG/15ML | INTRAVENOUS | Qty: 15

## 2019-01-01 NOTE — Other (Signed)
Patient to the floor ambulatory for her last injectafer. Vital signs taken. Denies any discomfort. Call light within reach.

## 2019-01-01 NOTE — Other (Signed)
Infusion complete. Tolerated well. AVS printed and given to patient. Ambulated off of the unit. All equipment used in the care for this patient has been cleaned.

## 2019-01-07 ENCOUNTER — Ambulatory Visit: Admit: 2019-01-07 | Discharge: 2019-01-07 | Payer: PRIVATE HEALTH INSURANCE | Attending: Gastroenterology

## 2019-01-07 DIAGNOSIS — R110 Nausea: Secondary | ICD-10-CM

## 2019-01-07 MED ORDER — ONDANSETRON HCL 4 MG PO TABS
4 MG | ORAL_TABLET | Freq: Three times a day (TID) | ORAL | 0 refills | Status: AC | PRN
Start: 2019-01-07 — End: ?

## 2019-01-07 NOTE — Progress Notes (Addendum)
Subjective:      Patient ID: Brooke Porter is a 63 y.o. female who presents today for:  Chief Complaint   Patient presents with   ??? Follow-up     procedure   ??? Fatigue     did iron infusions       HPI   In today for follow-up visit, since last visit she had a colonoscopy that showed non bleeding external hemorrhoids, and a redundant colon, otherwise normal colon.  She also underwent a CT enterography, which showed no acute pathology. Since her last visit she also completed 2 iron infusions, still with complaints of generalized fatigue, no shortness of breath or chest pain.  Endorses a diet high in iron. She does c/o of rare intermittent nausea, no F/C, vomiting or wt loss. Denies hematemesis, melena, or hematochezia. Celiac studies are normal.   Note 12/12/18  Patient came in today for follow-up visit, since the last visit had an EGD that showed 6 cm hiatal hernia as well as LA grade B esophagitis.  Colonoscopy was not performed owing to poor prep.  Of note patient was supposed to have a capsule endoscopy as had EGD colonoscopy in 2018 however that was declined by the insurance.  Recommend repeat colonoscopy within 1 year.  Advised patient is been on iron supplement for iron deficiency anemia however still persistently iron deficient.  No overt bleeding reported.  No hematemesis, melena or hematochezia.  Note 11/26/2018:  Patient came in today for follow-up visit.????Since last seen, patient had cardiac catheterization that was negative for coronary artery disease. ??Patient received IV iron during recent hospital stay at [facility]. ??And came in today for follow-up and future plan of care. ??No overt bleeding reported in terms of hematemesis melena hematochezia.  Background  This is a very pleasant 63 year old who came in today for further evaluation and management of iron deficiency anemia. ??Patient denies any overt bleeding in term of hematemesis hematochezia, or black stool/melena. ??She reported that she has been  having fatigue and tiredness over the last year or two. ??She does report exertional dyspnea. ??Patient mentioned that she had the EGD and colonoscopy at outside facility within 1 year or almost 1 year back. ??And was negative except for a hiatal hernia. ??At the time of the EGD and??colonoscopy patient was reporting exertional shortness of breath. ??Current hemoglobin is 8.7 from 9.4 in 03/12/2018. ??Noted hemoglobin 11.8 in 2018 down from 13.8 in 2016.????Otherwise patient has iron studies and with evidence of ??iron deficiency anemia. ??Denies NSAID use.????No anticoagulants reported. ??Patient mentioned that she had sent EKG that shows left branch bundle block and currently has been followed with cardiology was told to have normal EKG prior to that.????She came in today for initial visit    Past Medical History:   Diagnosis Date   ??? Depression    ??? Hiatal hernia    ??? Hyperlipidemia    ??? Hypertension    ??? Overactive bladder    ??? Social anxiety disorder    ??? Thyroid disease    ??? Vitamin D deficiency      Past Surgical History:   Procedure Laterality Date   ??? CHOLECYSTECTOMY     ??? COLONOSCOPY     ??? COLONOSCOPY N/A 12/20/2018    COLONOSCOPY DIAGNOSTIC performed by Thamas Jaegers, MD at [facility]   ??? ENDOSCOPY, COLON, DIAGNOSTIC     ??? HYSTERECTOMY     ??? TONSILLECTOMY     ??? UPPER GASTROINTESTINAL ENDOSCOPY N/A 12/04/2018  EGD ESOPHAGOGASTRODUODENOSCOPY performed by Thamas Jaegers, MD at [facility]     Social History     Socioeconomic History   ??? Marital status: Single     Spouse name: Not on file   ??? Number of children: Not on file   ??? Years of education: Not on file   ??? Highest education level: Not on file   Occupational History   ??? Not on file   Social Needs   ??? Financial resource strain: Not on file   ??? Food insecurity:     Worry: Not on file     Inability: Not on file   ??? Transportation needs:     Medical: Not on file     Non-medical: Not on file   Tobacco Use   ??? Smoking status: Never Smoker   ??? Smokeless  tobacco: Never Used   Substance and Sexual Activity   ??? Alcohol use: Not Currently   ??? Drug use: Never   ??? Sexual activity: Not on file   Lifestyle   ??? Physical activity:     Days per week: Not on file     Minutes per session: Not on file   ??? Stress: Not on file   Relationships   ??? Social connections:     Talks on phone: Not on file     Gets together: Not on file     Attends religious service: Not on file     Active member of club or organization: Not on file     Attends meetings of clubs or organizations: Not on file     Relationship status: Not on file   ??? Intimate partner violence:     Fear of current or ex partner: Not on file     Emotionally abused: Not on file     Physically abused: Not on file     Forced sexual activity: Not on file   Other Topics Concern   ??? Not on file   Social History Narrative   ??? Not on file     Family History   Problem Relation Age of Onset   ??? Celiac Disease Neg Hx    ??? Colon Cancer Neg Hx    ??? Crohn's Disease Neg Hx      Allergies   Allergen Reactions   ??? Levofloxacin      Other reaction(s): Other: See Comments  tendinitis all over the muscles  Other reaction(s): Other: See Comments  tendinitis all over the muscles           Review of Systems   Constitutional: Positive for fatigue. Negative for appetite change, chills, fever and unexpected weight change.   HENT: Negative for nosebleeds, tinnitus, trouble swallowing and voice change.    Eyes: Negative for photophobia, pain and redness.   Respiratory: Negative for chest tightness, shortness of breath and wheezing.    Cardiovascular: Negative for chest pain, palpitations and leg swelling.   Gastrointestinal: Positive for nausea. Negative for abdominal distention, abdominal pain, anal bleeding, blood in stool, constipation, diarrhea, rectal pain and vomiting.   Endocrine: Negative for polydipsia, polyphagia and polyuria.   Genitourinary: Negative for difficulty urinating and hematuria.   Skin: Negative for color change, pallor and rash.    Neurological: Negative for dizziness, speech difficulty and headaches.   Psychiatric/Behavioral: Negative for confusion and suicidal ideas.       Objective:   BP 118/80 (Site: Right Upper Arm, Position: Sitting, Cuff Size: Medium Adult)  Pulse 92    Ht 5\' 6"  (1.676 m)    Wt 231 lb (104.8 kg)    SpO2 97%    BMI 37.28 kg/m??     Physical Exam  Vitals signs reviewed.   Constitutional:       General: She is not in acute distress.     Appearance: Normal appearance. She is well-developed and well-groomed.   HENT:      Head: Normocephalic and atraumatic.      Nose: Nose normal.   Eyes:      General: No scleral icterus.     Extraocular Movements: Extraocular movements intact.      Conjunctiva/sclera: Conjunctivae normal.      Pupils: Pupils are equal, round, and reactive to light.   Neck:      Musculoskeletal: Neck supple.   Cardiovascular:      Rate and Rhythm: Normal rate and regular rhythm.      Pulses: Normal pulses.      Heart sounds: Normal heart sounds.   Pulmonary:      Effort: Pulmonary effort is normal. No respiratory distress.      Breath sounds: Normal breath sounds. No wheezing or rales.   Abdominal:      General: Abdomen is flat. Bowel sounds are normal. There is no distension.      Palpations: Abdomen is soft. There is no hepatomegaly, splenomegaly or mass.      Tenderness: There is no tenderness. There is no guarding or rebound.   Musculoskeletal: Normal range of motion.         General: No tenderness or deformity.      Right lower leg: No edema.      Left lower leg: No edema.   Skin:     General: Skin is warm and dry.      Capillary Refill: Capillary refill takes less than 2 seconds.      Coloration: Skin is not jaundiced.      Findings: No erythema or rash.   Neurological:      General: No focal deficit present.      Mental Status: She is alert and oriented to person, place, and time.   Psychiatric:         Mood and Affect: Mood normal.         Behavior: Behavior normal.         Laboratory, Pathology,  Radiology reviewed in detail with relevantimportant investigations summarized below:  Lab Results   Component Value Date    WBC 5.9 12/12/2018    WBC 11.0 11/10/2018    HGB 9.2 12/12/2018    HGB 8.7 11/10/2018    HCT 30.5 12/12/2018    HCT 28.6 11/10/2018    MCV 73.7 12/12/2018    MCV 69.4 11/10/2018    PLT 348 12/12/2018    PLT 522 11/10/2018    .  Lab Results   Component Value Date    ALT 29 11/10/2018    ALT 321 11/25/2013    AST 26 11/10/2018    AST 115 11/25/2013    ALKPHOS 136 11/10/2018    ALKPHOS 247 11/25/2013    BILITOT 0.3 11/10/2018    BILITOT 0.5 11/25/2013       Ct Enterography W Contrast    Result Date: 12/27/2018  EXAMINATION: CT ENTEROGRAPHY W CONTRAST DATE AND TIME:12/27/2018 9:36 AM CLINICAL HISTORY: Acute abdominal pain. Epigastric pain.  D50.9 Iron deficiency anemia, unspecified iron deficiency anemia type ICD10 COMPARISON: None available. TECHNIQUE: Contiguous axial  CT sections of the abdomen and pelvis.  100 cc's of IV contrast given.  All CT scans at this facility use dose modulation, iterative reconstruction, and/or weight based dosing when appropriate to reduce radiation dose to as low as reasonably achievable. FINDINGS    Liver: Negative     Spleen: Negative    Pancreas: Negative   Gallbladder: No calcified gallstones. Normal gallbladder wall. No pericholecystic fluid.         Kidney: No solid renal  lesions, or hydronephrosis.No renal or ureteral stone.   Adrenal glands are negative.   Bowel: No bowel obstruction. No mass. No inflammation. Large hiatal hernia. Appendix: There  is no CT evidence for appendicitis.    Retroperitoneum: No lymphadenopathy or fluid collection or mass.   Nodes: Some small subcentimeter external iliac nodes. No lymphadenopathy.   Aorta: No aneurysm    Peritoneum: No free fluid or free air. The abdominal wall is intact.       Pelvis: No abnormal soft tissue mass. The bladder is normal.   Bones: Unremarkable     Lung bases: Small airways disease changes at the lung  bases.       NO ACUTE PATHOLOGY IN THE ABDOMEN OR PELVIS.     Lab Results   Component Value Date    IRON 17 11/10/2018    TIBC 414 11/10/2018    FERRITIN 7.0 11/10/2018     Lab Results   Component Value Date    INR 1.0 11/10/2018     No components found for: ACUTEHEPATITISSCREEN  No components found for: CELIACPANEL  No components found for: STOOLCULTURE, C.DIFF, STOOLOVAPARASITE, STOOLLEUCOCYTE        Assessment:       Diagnosis Orders   1. Nausea  ondansetron (ZOFRAN) 4 MG tablet   2. Iron deficiency anemia, unspecified iron deficiency anemia type  Amb External Referral To Gastroenterology    Ferritin    Iron And Tibc   3. Gastroesophageal reflux disease without esophagitis           Plan:     Orders Placed This Encounter   Procedures   ??? Ferritin     Standing Status:   Future     Standing Expiration Date:   01/08/2020   ??? Iron And Tibc     Standing Status:   Future     Standing Expiration Date:   01/08/2020     Order Specific Question:   Is Patient Fasting?     Answer:   no     Order Specific Question:   No of Hours?     Answer:   0   ??? Amb External Referral To Gastroenterology     Referral Priority:   Routine     Referral Type:   Eval and Treat     Requested Specialty:   Gastroenterology     Number of Visits Requested:   1     Orders Placed This Encounter   Medications   ??? ondansetron (ZOFRAN) 4 MG tablet     Sig: Take 1 tablet by mouth 3 times daily as needed for Nausea or Vomiting     Dispense:  30 tablet     Refill:  0     Plan   1. IDA  -[facility] -Capsule endoscopy (pt preference and scheduling needs)  -Iron studies  -EGD and colonoscopy with no source identified of the iron deficiency  -Overt bleed reported.  Current hemoglobin stable  2. Nausea recent EGD  6cm HH  - Zofran 4 mg TID as needed  3.Heartburn / GERD  = Advised on the correct dose and timing of the PPI, 20 to 30 min before breakfast   = Pathophysiology and etiology of reflux discussed at length  = Lifestyle modification  recommended and discussed with the patient   --Avoid spicy and acidic based foods   --Limit coffee, tea, alcohol use   --Limit the amount of food during meal time   --Avoid bedtime snacks and eat meals 3 to 4 hrs before lying down   --Elevate the head of the bed    --Keep healthy weight  In Context of large hiatal hernia.  Patient interested on pursuing with surgical approach.  Will refer to thoracic surgery accordingly    Return in about 3 weeks (around 01/28/2019) for post procedure results.      Thamas Jaegers, MD

## 2019-01-14 ENCOUNTER — Encounter: Attending: Gastroenterology

## 2019-01-23 ENCOUNTER — Encounter: Attending: Gastroenterology

## 2019-02-06 ENCOUNTER — Ambulatory Visit: Admit: 2019-02-06 | Discharge: 2019-02-06 | Payer: PRIVATE HEALTH INSURANCE | Attending: Family

## 2019-02-06 ENCOUNTER — Encounter

## 2019-02-06 DIAGNOSIS — D509 Iron deficiency anemia, unspecified: Secondary | ICD-10-CM

## 2019-02-06 NOTE — Progress Notes (Signed)
Subjective:      Patient ID: Brooke Porter is a 63 y.o. female who presents today for:  No chief complaint on file.      HPI   Interval visit: pt came in today for follow up and test results. Since her last visit she underwent a SBPC endoscopy at [facility], results are pending. She has no further complaints of nausea or vomiting, her GERD is well controlled with PPI. She has complaints of generalized fatigue, she has had 2 outpatient iron infusions, she has no shortness of breath or chest pain.  She denies hematemesis, melena, or hematochezia.       OV 01/07/2019 , since last visit she had a colonoscopy that showed non bleeding external hemorrhoids, and a redundant colon, otherwise normal colon.  She also underwent a CT enterography, which showed no acute pathology. Since her last visit she also completed 2 iron infusions, still with complaints of generalized fatigue, no shortness of breath or chest pain.  Endorses a diet high in iron. She does c/o of rare intermittent nausea, no F/C, vomiting or wt loss. Denies hematemesis, melena, or hematochezia. Celiac studies are normal.   Note 12/12/18  Patient came in today for follow-up visit,??since the last visit had an EGD that showed 6 cm hiatal hernia as well as LA grade B esophagitis. ??Colonoscopy was not performed owing to poor prep. ??Of note patient was supposed to have a capsule endoscopy as??had EGD colonoscopy in 2018 however that was declined by the insurance. ??Recommend repeat colonoscopy within 1 year.????Advised patient is been on iron supplement for iron deficiency anemia however still persistently iron deficient. ??No overt bleeding reported. ??No hematemesis, melena or hematochezia.  Note??11/26/2018:  Patient came in today for follow-up visit.????Since last seen, patient had cardiac catheterization that was negative for coronary artery disease. ??Patient received IV iron during recent hospital stay at [facility] ??And came in today for follow-up and future plan of  care. ??No overt bleeding reported in terms of hematemesis melena hematochezia.  Background  This is a very pleasant 63 year old who came in today for further evaluation and management of iron deficiency anemia. ??Patient denies any overt bleeding in term of hematemesis hematochezia, or black stool/melena. ??She reported that she has been having fatigue and tiredness over the last year or two. ??She does report exertional dyspnea. ??Patient mentioned that she had the EGD and colonoscopy at outside facility within 1 year or almost 1 year back. ??And was negative except for a hiatal hernia. ??At the time of the EGD and??colonoscopy patient was reporting exertional shortness of breath. ??Current hemoglobin is 8.7 from 9.4 in 03/12/2018. ??Noted hemoglobin 11.8 in 2018 down from 13.8 in 2016.????Otherwise patient has iron studies and with evidence of ??iron deficiency anemia. ??Denies NSAID use.????No anticoagulants reported. ??Patient mentioned that she had sent EKG that shows left branch bundle block and currently has been followed with cardiology was told to have normal EKG prior to that.????She came in today for initial visit  Past Medical History:   Diagnosis Date   ??? Depression    ??? Hiatal hernia    ??? Hyperlipidemia    ??? Hypertension    ??? Overactive bladder    ??? Social anxiety disorder    ??? Thyroid disease    ??? Vitamin D deficiency      Past Surgical History:   Procedure Laterality Date   ??? CHOLECYSTECTOMY     ??? COLONOSCOPY     ??? COLONOSCOPY N/A 12/20/2018  COLONOSCOPY DIAGNOSTIC performed by Thamas JaegersHicham Khallafi, MD at [facility]   ??? ENDOSCOPY, COLON, DIAGNOSTIC     ??? HYSTERECTOMY     ??? TONSILLECTOMY     ??? UPPER GASTROINTESTINAL ENDOSCOPY N/A 12/04/2018    EGD ESOPHAGOGASTRODUODENOSCOPY performed by [facility]amas [facility]egers[facility]icham Khallafi, [facility]     Social History     Socioeconomic History   ??? Marital status: Single     Spouse name: Not on file   ??? Number of children: Not on file   ??? Years of education: Not on file   ??? Highest  education level: Not on file   Occupational History   ??? Not on file   Social Needs   ??? Financial resource strain: Not on file   ??? Food insecurity:     Worry: Not on file     Inability: Not on file   ??? Transportation needs:     Medical: Not on file     Non-medical: Not on file   Tobacco Use   ??? Smoking status: Never Smoker   ??? Smokeless tobacco: Never Used   Substance and Sexual Activity   ??? Alcohol use: Not Currently   ??? Drug use: Never   ??? Sexual activity: Not on file   Lifestyle   ??? Physical activity:     Days per week: Not on file     Minutes per session: Not on file   ??? Stress: Not on file   Relationships   ??? Social connections:     Talks on phone: Not on file     Gets together: Not on file     Attends religious service: Not on file     Active member of club or organization: Not on file     Attends meetings of clubs or organizations: Not on file     Relationship status: Not on file   ??? Intimate partner violence:     Fear of current or ex partner: Not on file     Emotionally abused: Not on file     Physically abused: Not on file     Forced sexual activity: Not on file   Other Topics Concern   ??? Not on file   Social History Narrative   ??? Not on file     Family History   Problem Relation Age of Onset   ??? Celiac Disease Neg Hx    ??? Colon Cancer Neg Hx    ??? Crohn's Disease Neg Hx      Allergies   Allergen Reactions   ??? Levofloxacin      Other reaction(s): Other: See Comments  tendinitis all over the muscles  Other reaction(s): Other: See Comments  tendinitis all over the muscles           Review of Systems   Constitutional: Positive for fatigue. Negative for appetite change, chills, fever and unexpected weight change.   HENT: Negative for nosebleeds, tinnitus, trouble swallowing and voice change.    Eyes: Negative for photophobia, pain and redness.   Respiratory: Negative for chest tightness, shortness of breath and wheezing.    Cardiovascular: Negative for chest pain, palpitations and leg swelling.    Gastrointestinal: Negative for abdominal distention, abdominal pain, anal bleeding, blood in stool, constipation, diarrhea, nausea, rectal pain and vomiting.   Endocrine: Negative for polydipsia, polyphagia and polyuria.   Genitourinary: Negative for difficulty urinating and hematuria.   Skin: Negative for color change, pallor and rash.  Neurological: Negative for dizziness, speech difficulty and headaches.   Psychiatric/Behavioral: Negative for confusion and suicidal ideas.       Objective:   BP 116/82    Pulse 95    Wt 238 lb (108 kg)    SpO2 96%    BMI 38.41 kg/m??     Physical Exam  Vitals signs reviewed.   Constitutional:       General: She is not in acute distress.     Appearance: Normal appearance. She is well-developed and well-groomed.   HENT:      Head: Normocephalic and atraumatic.      Nose: Nose normal.   Eyes:      General: No scleral icterus.     Extraocular Movements: Extraocular movements intact.      Conjunctiva/sclera: Conjunctivae normal.      Pupils: Pupils are equal, round, and reactive to light.   Neck:      Musculoskeletal: Neck supple.   Cardiovascular:      Rate and Rhythm: Normal rate and regular rhythm.      Pulses: Normal pulses.      Heart sounds: Normal heart sounds.   Pulmonary:      Effort: Pulmonary effort is normal. No respiratory distress.      Breath sounds: Normal breath sounds. No wheezing or rales.   Abdominal:      General: Abdomen is flat. Bowel sounds are normal. There is no distension.      Palpations: Abdomen is soft. There is no hepatomegaly, splenomegaly or mass.      Tenderness: There is no abdominal tenderness. There is no guarding or rebound.   Musculoskeletal: Normal range of motion.         General: No tenderness or deformity.      Right lower leg: No edema.      Left lower leg: No edema.   Skin:     General: Skin is warm and dry.      Capillary Refill: Capillary refill takes less than 2 seconds.      Coloration: Skin is not jaundiced.      Findings: No erythema  or rash.   Neurological:      General: No focal deficit present.      Mental Status: She is alert and oriented to person, place, and time.   Psychiatric:         Mood and Affect: Mood normal.         Behavior: Behavior normal.         Laboratory, Pathology, Radiology reviewed in detail with relevantimportant investigations summarized below:  Lab Results   Component Value Date    WBC 5.9 12/12/2018    WBC 11.0 11/10/2018    HGB 9.2 12/12/2018    HGB 8.7 11/10/2018    HCT 30.5 12/12/2018    HCT 28.6 11/10/2018    MCV 73.7 12/12/2018    MCV 69.4 11/10/2018    PLT 348 12/12/2018    PLT 522 11/10/2018    .  Lab Results   Component Value Date    ALT 29 11/10/2018    ALT 321 11/25/2013    AST 26 11/10/2018    AST 115 11/25/2013    ALKPHOS 136 11/10/2018    ALKPHOS 247 11/25/2013    BILITOT 0.3 11/10/2018    BILITOT 0.5 11/25/2013       No results found.  Lab Results   Component Value Date    IRON 17 11/10/2018    TIBC  414 11/10/2018    FERRITIN 7.0 11/10/2018     Lab Results   Component Value Date    INR 1.0 11/10/2018     No components found for: ACUTEHEPATITISSCREEN  No components found for: CELIACPANEL  No components found for: STOOLCULTURE, C.DIFF, STOOLOVAPARASITE, STOOLLEUCOCYTE        Assessment:       Diagnosis Orders   1. Iron deficiency anemia, unspecified iron deficiency anemia type  CBC   2. Gastroesophageal reflux disease without esophagitis     3. Nausea           Plan:      Orders Placed This Encounter   Procedures   ??? CBC     Standing Status:   Future     Standing Expiration Date:   02/07/2020     No orders of the defined types were placed in this encounter.  1. IDA  - SBPC -Capsule endoscopy completed @ [facility] 01/29/19, faxed request for release of records this will be called to pt when available  -Iron studies  -EGD and colonoscopy with no source identified of the iron deficiency  - no Overt bleed reported.  Current hemoglobin stable  2. Nausea recent EGD  6cm HH  - Zofran 4 mg TID as needed, has only used 2  times in the last month  3.Heartburn / GERD  = Advised on the correct dose and timing of the PPI, 20 to 30 min before breakfast   = Pathophysiology and etiology of reflux discussed at length  = Lifestyle modification recommended and discussed with the patient              --Avoid spicy and acidic based foods              --Limit coffee, tea, alcohol use              --Limit the amount of food during meal time              --Avoid bedtime snacks and eat meals 3 to 4 hrs before lying down              --Elevate the head of the bed               --Keep healthy weight  In Context of large hiatal hernia.  Patient interested on pursuing with surgical approach, once pill capsule endoscopy results are avaialbel.  Will refer to thoracic surgery accordingly    Return in about 3 months (around 05/07/2019).      Fransisca Kaufmann, APRN - CNP

## 2019-02-07 LAB — CBC
Hematocrit: 41.5 % (ref 37.0–47.0)
Hemoglobin: 13.6 g/dL (ref 12.0–16.0)
MCH: 28.1 pg (ref 27.0–31.3)
MCHC: 32.7 % — ABNORMAL LOW (ref 33.0–37.0)
MCV: 85.9 fL (ref 82.0–100.0)
Platelets: 334 10*3/uL (ref 130–400)
RBC: 4.83 M/uL (ref 4.20–5.40)
RDW: 28.2 % — ABNORMAL HIGH (ref 11.5–14.5)
WBC: 7.4 10*3/uL (ref 4.8–10.8)

## 2019-02-14 NOTE — Telephone Encounter (Signed)
Pt called asking for lab and capsule endoscopy results. She can be reached at [phone]. Thanks

## 2019-02-17 NOTE — Telephone Encounter (Signed)
Called pt and discussed SBPC endoscopy results which were normal, pt would like to proceed with [facility] repair with Dr Lockie Pares but prefers to wait until May as she is leaving out of town to and will not return until May  Jen

## 2019-05-02 ENCOUNTER — Inpatient Hospital Stay: Admit: 2019-05-02 | Payer: PRIVATE HEALTH INSURANCE

## 2019-05-02 DIAGNOSIS — D509 Iron deficiency anemia, unspecified: Secondary | ICD-10-CM

## 2019-05-02 LAB — RETICULOCYTES
Retic Ct Abs: 0.085 10*6/uL (ref 0.022–0.111)
Retic Ct Pct: 1.8 % (ref 0.6–2.2)

## 2019-05-02 LAB — CBC
Hematocrit: 44.1 % (ref 37.0–47.0)
Hemoglobin: 14.4 g/dL (ref 12.0–16.0)
MCH: 30.9 pg (ref 27.0–31.3)
MCHC: 32.7 % — ABNORMAL LOW (ref 33.0–37.0)
MCV: 94.5 fL (ref 82.0–100.0)
Platelets: 308 10*3/uL (ref 130–400)
RBC: 4.67 M/uL (ref 4.20–5.40)
RDW: 14 % (ref 11.5–14.5)
WBC: 6.8 10*3/uL (ref 4.8–10.8)

## 2019-05-02 LAB — IRON AND TIBC
Iron Saturation: 28 % (ref 11–46)
Iron: 70 ug/dL (ref 37–145)
TIBC: 248 ug/dL (ref 178–450)

## 2019-05-02 LAB — TSH: TSH: 5.55 u[IU]/mL — ABNORMAL HIGH (ref 0.440–3.860)

## 2019-05-02 LAB — T4, FREE: T4 Free: 1.14 ng/dL (ref 0.84–1.68)

## 2019-05-02 LAB — T3, FREE: T3, Free: 2.5 pg/mL (ref 2.0–4.4)

## 2019-05-03 LAB — THYROID PEROXIDASE ANTIBODY: Thyroid Peroxidase (TPO) Abs: 238 IU/mL — ABNORMAL HIGH (ref 0.0–9.0)

## 2019-05-04 LAB — ANTI-THYROGLOBULIN ANTIBODY: Thyroglobulin Ab: <0.9 IU/mL (ref 0.0–4.0)

## 2019-05-08 ENCOUNTER — Telehealth: Admit: 2019-05-08 | Discharge: 2019-05-08 | Payer: PRIVATE HEALTH INSURANCE | Attending: Family

## 2019-05-08 DIAGNOSIS — K219 Gastro-esophageal reflux disease without esophagitis: Secondary | ICD-10-CM

## 2019-05-08 NOTE — Progress Notes (Signed)
Subjective:      Patient ID: Brooke Porter is a 63 y.o. female who presents today for:  Chief Complaint   Patient presents with   . Follow-up   . Gastroesophageal Reflux     occasional heartburn.       HPI  This visit has been rescheduled as a phone visit to comply with patient safety concerns in accordance with CDC recommendations. Total time of discussion was 22 minutes, she understands there is a bill associated with this visit and she agrees to proceed. Since her last visit she has been doing well she reports rare N/V, has PRN zofran as needed. Is taking PPI with good control of GERD symptoms. She has complaints of generalized fatigue, recent CBC and iron studies are normal, she is seeing an endocrinologist tomorrow for a thyroid issue. She denies N/V, hematemesis, melena, or hematochezia.     Background  OV 02/06/2019 pt came in today for follow up and test results. Since her last visit she underwent a SBPC endoscopy at [facility], results are pending. She has no further complaints of nausea or vomiting, her GERD is well controlled with PPI. She has complaints of generalized fatigue, she has had 2 outpatient iron infusions, she has no shortness of breath or chest pain.  She denies hematemesis, melena, or hematochezia.       OV 01/07/2019 , since last visit she had a colonoscopythat showed nonbleeding external hemorrhoids, andaredundant colon, otherwise normal colon. She also underwent a CT enterography, which showed no acute pathology. Since herlast visit she alsocompleted 2 iron infusions,still with complaints of generalized fatigue, no shortness of breath or chest pain. Endorses a diet high in iron. She does c/o of rare intermittent nausea, no F/C, vomiting or wt loss. Denieshematemesis, melena, or hematochezia. Celiac studies are normal.  Note 12/12/18  Patient came in today for follow-up visit,since the last visit had an EGD that showed 6 cm hiatal hernia as well as LA grade B esophagitis.  Colonoscopy was not performed owing to poor prep. Of note patient was supposed to have a capsule endoscopy ashad EGD colonoscopy in 2018 however that was declined by the insurance. Recommend repeat colonoscopy within 1 year.Advised patient is been on iron supplement for iron deficiency anemia however still persistently iron deficient. No overt bleeding reported. No hematemesis, melena or hematochezia.  Note12/02/2018:  Patient came in today for follow-up visit.Since last seen, patient had cardiac catheterization that was negative for coronary artery disease. Patient received IV iron during recent hospital stay at [facility]. And came in today for follow-up and future plan of care. No overt bleeding reported in terms of hematemesis melena hematochezia.  Background  This is a very pleasant 63 year old who came in today for further evaluation and management of iron deficiency anemia. Patient denies any overt bleeding in term of hematemesis hematochezia, or black stool/melena. She reported that she has been having fatigue and tiredness over the last year or two. She does report exertional dyspnea. Patient mentioned that she had the EGD and colonoscopy at outside facility within 1 year or almost 1 year back. And was negative except for a hiatal hernia. At the time of the EGD andcolonoscopy patient was reporting exertional shortness of breath. Current hemoglobin is 8.7 from 9.4 in 03/12/2018. Noted hemoglobin 11.8 in 2018 down from 13.8 in 2016.Otherwise patient has iron studies and with evidence of iron deficiency anemia. Denies NSAID use.No anticoagulants reported. Patient mentioned that she had sent EKG that shows left branch bundle  block and currently has been followed with cardiology was told to have normal EKG prior to that.She came in today for initial visit  Past Medical History:   Diagnosis Date   . Depression    . Hiatal hernia    . Hyperlipidemia    . Hypertension    . Overactive  bladder    . Social anxiety disorder    . Thyroid disease    . Vitamin D deficiency      Past Surgical History:   Procedure Laterality Date   . CHOLECYSTECTOMY     . COLONOSCOPY     . COLONOSCOPY N/A 12/20/2018    COLONOSCOPY DIAGNOSTIC performed by Thamas Jaegers, MD at [facility]   . ENDOSCOPY, COLON, DIAGNOSTIC     . HYSTERECTOMY     . TONSILLECTOMY     . UPPER GASTROINTESTINAL ENDOSCOPY N/A 12/04/2018    EGD ESOPHAGOGASTRODUODENOSCOPY performed by Thamas Jaegers, MD at [facility]     Social History     Socioeconomic History   . Marital status: Single     Spouse name: Not on file   . Number of children: Not on file   . Years of education: Not on file   . Highest education level: Not on file   Occupational History   . Not on file   Social Needs   . Financial resource strain: Not on file   . Food insecurity     Worry: Not on file     Inability: Not on file   . Transportation needs     Medical: Not on file     Non-medical: Not on file   Tobacco Use   . Smoking status: Never Smoker   . Smokeless tobacco: Never Used   Substance and Sexual Activity   . Alcohol use: Not Currently   . Drug use: Never   . Sexual activity: Not on file   Lifestyle   . Physical activity     Days per week: Not on file     Minutes per session: Not on file   . Stress: Not on file   Relationships   . Social Wellsite geologist on phone: Not on file     Gets together: Not on file     Attends religious service: Not on file     Active member of club or organization: Not on file     Attends meetings of clubs or organizations: Not on file     Relationship status: Not on file   . Intimate partner violence     Fear of current or ex partner: Not on file     Emotionally abused: Not on file     Physically abused: Not on file     Forced sexual activity: Not on file   Other Topics Concern   . Not on file   Social History Narrative   . Not on file     Family History   Problem Relation Age of Onset   . Celiac Disease Neg Hx    . Colon  Cancer Neg Hx    . Crohn's Disease Neg Hx      Allergies   Allergen Reactions   . Levofloxacin Other (See Comments)     Other reaction(s): Other: See Comments  tendinitis all over the muscles  Other reaction(s): Other: See Comments  tendinitis all over the muscles           Review of Systems  Constitutional: Negative for appetite change, chills, fever and unexpected weight change.   HENT: Negative for nosebleeds, tinnitus, trouble swallowing and voice change.    Eyes: Negative for photophobia, pain and redness.   Respiratory: Negative for chest tightness, shortness of breath and wheezing.    Cardiovascular: Negative for chest pain, palpitations and leg swelling.   Gastrointestinal: Positive for nausea (rare). Negative for abdominal distention, abdominal pain, anal bleeding, blood in stool, constipation, diarrhea, rectal pain and vomiting.   Endocrine: Negative for polydipsia, polyphagia and polyuria.   Genitourinary: Negative for difficulty urinating and hematuria.   Skin: Negative for color change, pallor and rash.   Neurological: Negative for dizziness, speech difficulty and headaches.   Psychiatric/Behavioral: Negative for confusion and suicidal ideas.       Objective:   There were no vitals taken for this visit.    Physical Exam    Laboratory, Pathology, Radiology reviewed in detail with relevantimportant investigations summarized below:  Lab Results   Component Value Date    WBC 6.8 05/02/2019    WBC 7.4 02/06/2019    WBC 5.9 12/12/2018    WBC 11.0 11/10/2018    HGB 14.4 05/02/2019    HGB 13.6 02/06/2019    HGB 9.2 12/12/2018    HGB 8.7 11/10/2018    HCT 44.1 05/02/2019    HCT 41.5 02/06/2019    HCT 30.5 12/12/2018    HCT 28.6 11/10/2018    MCV 94.5 05/02/2019    MCV 85.9 02/06/2019    MCV 73.7 12/12/2018    MCV 69.4 11/10/2018    PLT 308 05/02/2019    PLT 334 02/06/2019    PLT 348 12/12/2018    PLT 522 11/10/2018    .  Lab Results   Component Value Date    ALT 29 11/10/2018    ALT 321 11/25/2013    AST 26  11/10/2018    AST 115 11/25/2013    ALKPHOS 136 11/10/2018    ALKPHOS 247 11/25/2013    BILITOT 0.3 11/10/2018    BILITOT 0.5 11/25/2013       No results found.  Lab Results   Component Value Date    IRON 70 05/02/2019    IRON 17 11/10/2018    TIBC 248 05/02/2019    TIBC 414 11/10/2018    FERRITIN 7.0 11/10/2018     Lab Results   Component Value Date    INR 1.0 11/10/2018     No components found for: ACUTEHEPATITISSCREEN  No components found for: CELIACPANEL  No components found for: STOOLCULTURE, C.DIFF, STOOLOVAPARASITE, STOOLLEUCOCYTE        Assessment:       Diagnosis Orders   1. Gastroesophageal reflux disease without esophagitis     2. Hiatal hernia  [place] - Evelina Dun, MD, Cardiothoracic Surgery   3. Nausea           Plan:    1. Hx of IDA  -EGD/colonoscopy and SBPC with no source identified of the iron deficiency  - no Overt bleed reported.recent hemoglobin and iron studies stable  2. Nausea recent EGD 6cm HH  - Zofran 4 mg TID as needed  3.Heartburn / GERD  = Advised on the correct dose and timing of the PPI, 20 to 30 min before breakfast   = Pathophysiology and etiology of reflux discussed at length  = Lifestyle modification recommended and discussed with the patient  --Avoid spicy and acidic based foods  --Limit coffee, tea, alcohol use  --Limit the amount  of food during meal time  --Avoid bedtime snacks and eat meals 3 to 4 hrs before lying down  --Elevate the head of the bed   --Keep healthy weight    4. Hiatal Hernia- InContext of large hiatal hernia. Patient interested on pursuing with surgical approach, will refer to thoracic surgery accordingly  Orders Placed This Encounter   Procedures   . [place] - Evelina Dun, MD, Cardiothoracic Surgery     Referral Priority:   Routine     Referral Type:   Eval and Treat     Referral Reason:   Specialty Services Required     Referred to Provider:   Peyton Najjar, MD     Requested  Specialty:   Cardiothoracic Surgery     Number of Visits Requested:   1     No orders of the defined types were placed in this encounter.      Return in about 3 months (around 08/08/2019).      Fransisca Kaufmann, APRN - CNP

## 2019-05-16 NOTE — Telephone Encounter (Signed)
PT REFERRED BY JENNIFER MIZE, CNP FOR CONSULTATION WITH DR. ROBKE FOR HIATAL HERNIA.  DR. Lockie Pares WOULD LIKE HER TO HAVE CT CHEST W/O CONTRAST PRIOR TO CONSULTATION.    LMOM FOR PT TCB TO DISCUSS HAVING CT DONE.

## 2019-05-21 ENCOUNTER — Encounter: Attending: Thoracic Surgery (Cardiothoracic Vascular Surgery)

## 2019-06-09 ENCOUNTER — Encounter

## 2019-09-08 ENCOUNTER — Encounter: Attending: Thoracic Surgery (Cardiothoracic Vascular Surgery)

## 2019-09-26 ENCOUNTER — Inpatient Hospital Stay: Admit: 2019-09-26 | Payer: PRIVATE HEALTH INSURANCE

## 2019-09-26 DIAGNOSIS — K449 Diaphragmatic hernia without obstruction or gangrene: Secondary | ICD-10-CM

## 2019-10-16 NOTE — Telephone Encounter (Signed)
ERROR

## 2019-11-14 NOTE — Telephone Encounter (Signed)
Called pt to schedule appt for larger hiatal hernia, imaging not needed for appt. CT already in chart form 09/2019.

## 2019-11-21 NOTE — Telephone Encounter (Signed)
PT CALLED THE OFFICE BACK.  STATES SHE HAS DECIDED TO HOLD OFF ON ANY INTERVENTION FOR HERNIA UNTIL NEXT YEAR.  SHE WILL CONTACT THE OFFICE BACK WHEN SHE IS READY TO SCHEDULE.

## 2020-04-15 ENCOUNTER — Encounter

## 2020-04-21 ENCOUNTER — Inpatient Hospital Stay: Payer: PRIVATE HEALTH INSURANCE

## 2020-04-27 ENCOUNTER — Inpatient Hospital Stay: Admit: 2020-04-27 | Discharge: 2020-05-03 | Payer: PRIVATE HEALTH INSURANCE

## 2020-04-27 DIAGNOSIS — R0602 Shortness of breath: Secondary | ICD-10-CM

## 2020-04-27 MED ORDER — ALBUTEROL SULFATE (2.5 MG/3ML) 0.083% IN NEBU
Freq: Once | RESPIRATORY_TRACT | Status: AC
Start: 2020-04-27 — End: 2020-04-27
  Administered 2020-04-27: 17:00:00 2.5 mg via RESPIRATORY_TRACT

## 2020-04-27 MED FILL — ALBUTEROL SULFATE (2.5 MG/3ML) 0.083% IN NEBU: RESPIRATORY_TRACT | Qty: 3

## 2020-04-27 NOTE — Procedures (Signed)
[facility]                      [address] [place], [place] [address]                    PULMONARY FUNCTION  Brooke Porter   64 y.o.   female  Height 66 in  Weight 238 lb      Referring provider   Mort Sawyers, MD    Reading provider   [facility]    Test meets ATS criteria for acceptability and reproducibility Yes    Diagnosis: DOE: Yes  Cough   Yes, wheezing Yes  Smoking   no    Spirometry   FVC            2.70 L   78%  Post bronchodilator 2.70 L  78% Change 0 %  FEV1          2.26 L  85%   Post bronchodilator  2.28 L  86%   Change 0%  FEV1/FVC  83  %             Post bronchodilator  84 %  FEF25-75% 2.80 L  122%  Post bronchodilator  2.51 L  109%   Change -10%    Lung volume   SVC           2.77 L  80%   RV              2.19 L 100%   TLC            4.96  L 92%   RV/TLC      44 %    DLCO           96 %     Test interpretation     Spirometry essentially is normal, no obstructive defect, FVC is mildly reduced, no significant response to bronchodilator.  Lung volumes shows normal total lung capacity and residual volume indicating no restrictive lung disease, no air trapping or hyperinflation.  Diffusion capacity is normal.  ERV is reduced due to obesity.       Clinical correlation is recommended     [name] FCCP, 04/28/2020 11:45 AM

## 2024-02-14 IMAGING — NM Imaging study
1 series · 3 of 3 positions shown · non-contrast
Comparison: None

FINAL REPORT:
Nuclear medicine lymphoscintigraphy
INDICATION: Malignant neoplasm of upper-inner quadrant of left female breast
Estrogen receptor positive status (ER+)
TECHNIQUE: 1 mCi tech 99 Lymphoseek injected in the left  periareolar soft tissues.

[Series 1000: breast lympho statics · 1.65mm/px · 3 of 3 slices shown]
[im 1/3]
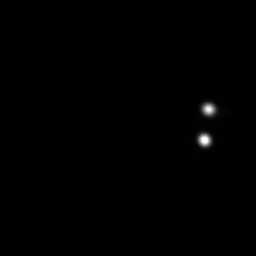
[im 2/3]
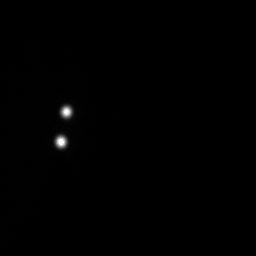
[im 3/3]
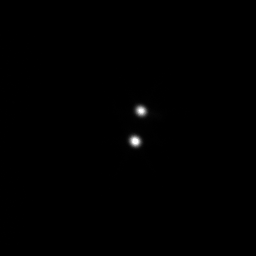

[3 of 3 positions shown; findings below may reference images not displayed]

FINDINGS: Intense focal activity localizing to the left  breast. No other uptake is visualized.
IMPRESSION: Successful left  breast lymphoscintigraphy. No uptake outside the breast is visualized.

## 2024-04-14 IMAGING — MR MRI KNEE RT WITHOUT
8 series · 40 of 40 positions shown · non-contrast
Comparison: None available

FINAL REPORT:
HISTORY: Internal derangement of the knee
Procedure: MRI of the right knee without contrast
TECHNIQUE: Multisequence, multiplanar imaging of the right knee was performed.

[Series 1: survey · axial · right · 2.0mm · 2.01mm/px · z∈[-79,+79]mm · 13 of 80 slices shown]
[im 1/80]
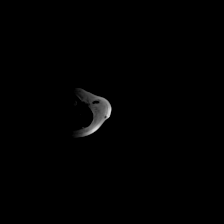
[im 7/80]
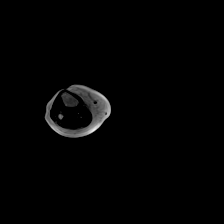
[im 14/80]
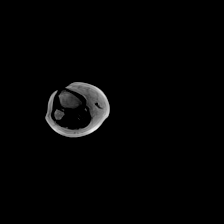
[im 20/80]
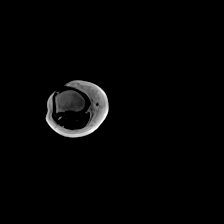
[im 27/80]
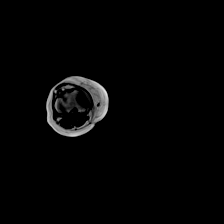
[im 33/80]
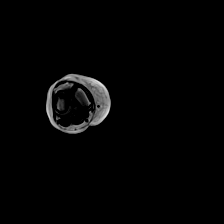
[im 40/80]
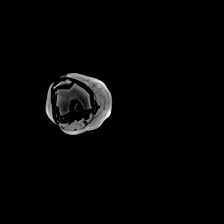
[im 47/80]
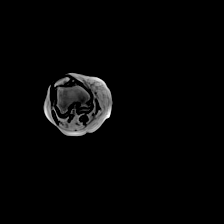
[im 53/80]
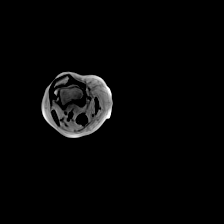
[im 60/80]
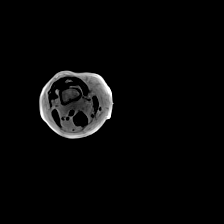
[im 66/80]
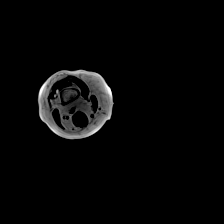
[im 73/80]
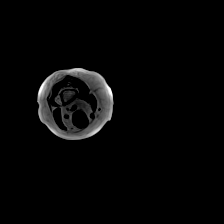
[im 80/80]
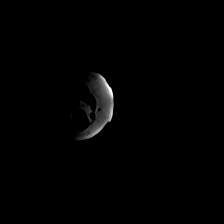

[Series 2: survey_mpr_sag · sagittal · right · 2.0mm · 2.00mm/px · 2 of 14 slices shown]
[im 1/14]
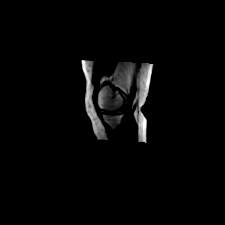
[im 14/14]
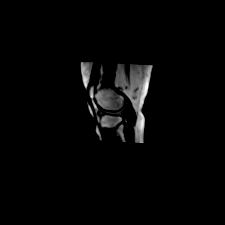

[Series 4: survey_mpr_cor · coronal · right · 2.0mm · 2.00mm/px · 1 of 9 slices shown]
[im 1/9]
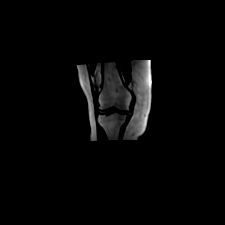

[Series 6: survey_mpr_(person_name) · axial · right · 2.0mm · 2.00mm/px · z∈[-52,+17]mm · 2 of 15 slices shown]
[im 1/15]
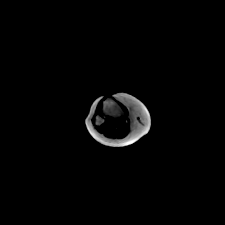
[im 15/15]
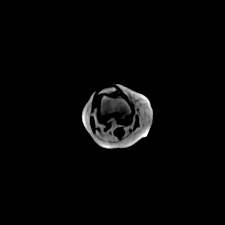

[Series 8: PD · sagittal · right · 3.0mm · 0.42mm/px · 5 of 32 slices shown]
[im 1/32]
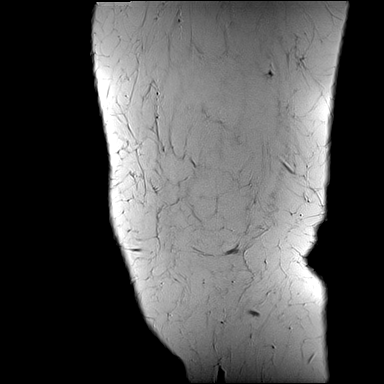
[im 8/32]
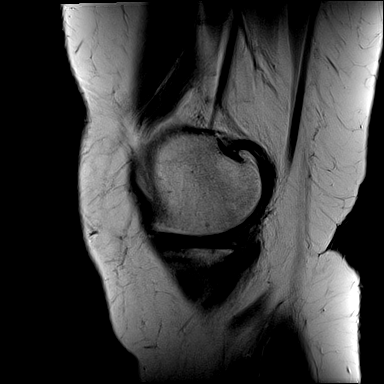
[im 16/32]
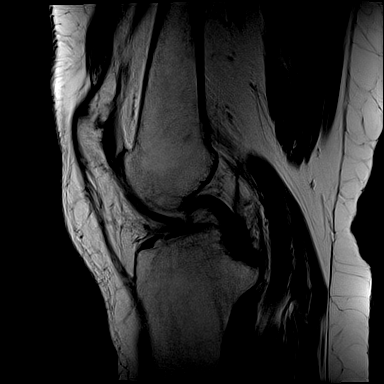
[im 24/32]
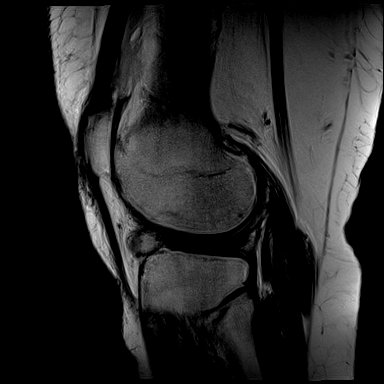
[im 32/32]
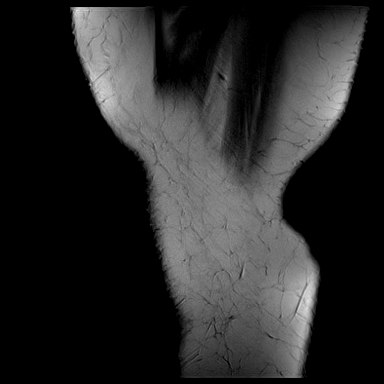

[Series 9: PD fat-sat · sagittal · right · 3.0mm · 0.42mm/px · 5 of 32 slices shown (1 of 3)]
[im 1/32]
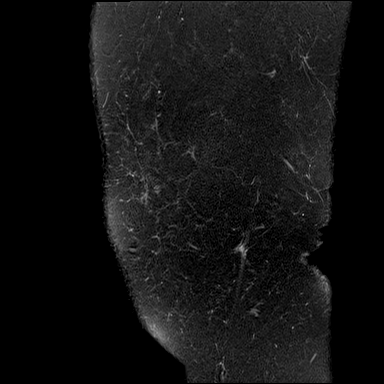
[im 8/32]
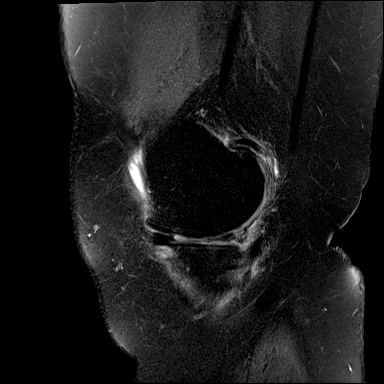
[im 16/32]
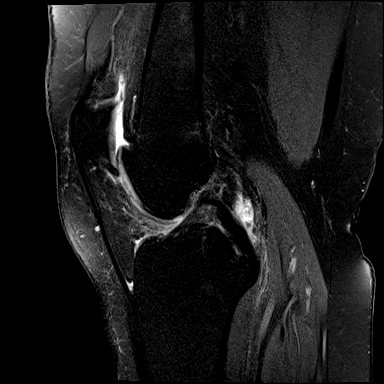
[im 24/32]
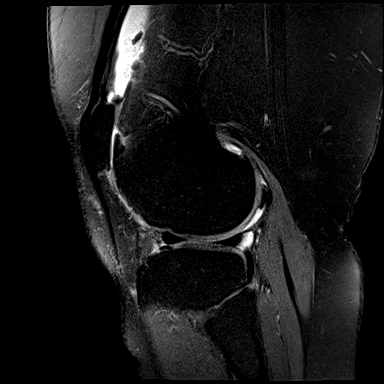
[im 32/32]
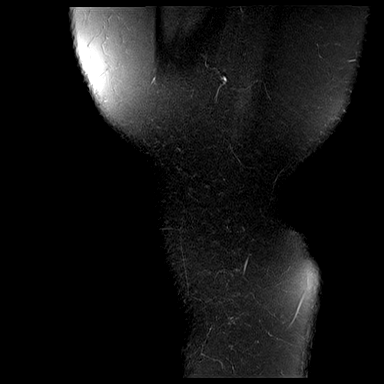

[Series 11: PD fat-sat · axial · right · 3.0mm · 0.50mm/px · z∈[-73,+72]mm · 7 of 45 slices shown (2 of 3)]
[im 1/45]
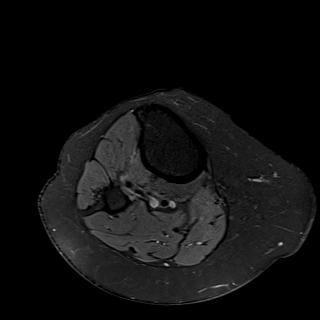
[im 8/45]
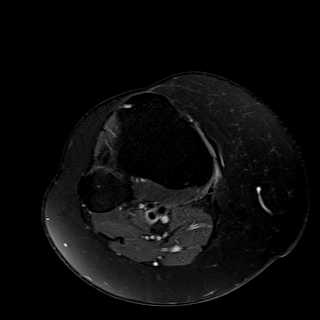
[im 15/45]
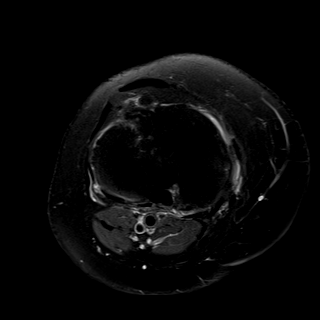
[im 23/45]
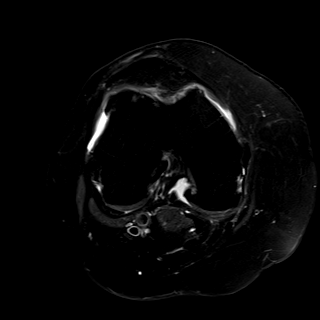
[im 30/45]
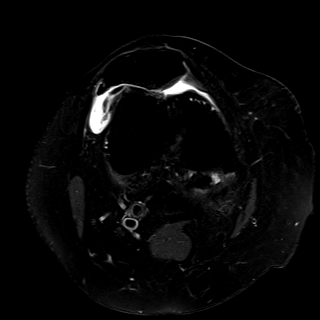
[im 37/45]
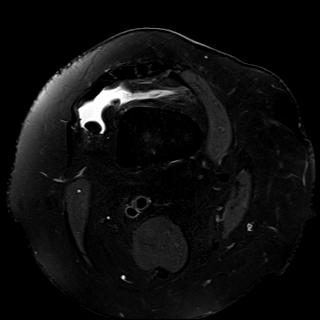
[im 45/45]
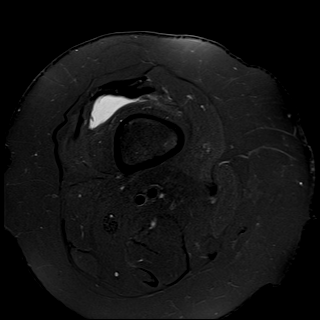

[Series 12: PD fat-sat · coronal · right · 3.0mm · 0.48mm/px · 5 of 35 slices shown (3 of 3)]
[im 1/35]
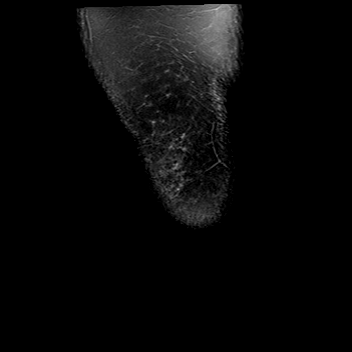
[im 9/35]
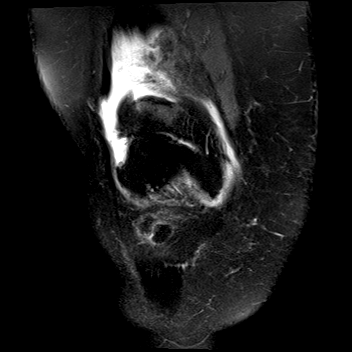
[im 18/35]
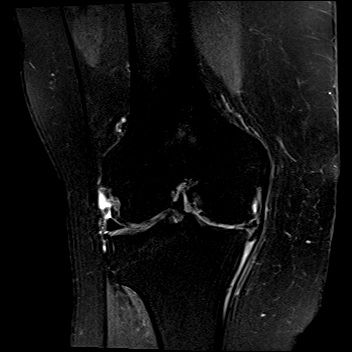
[im 26/35]
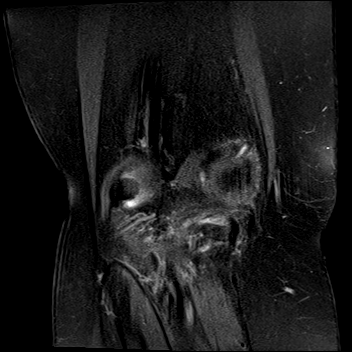
[im 35/35]
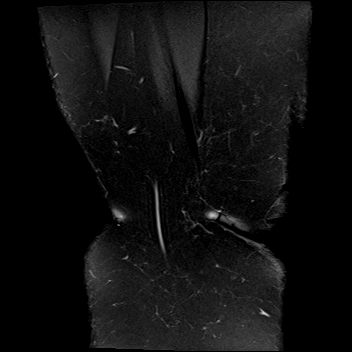

[40 of 40 positions shown; findings below may reference images not displayed]

FINDINGS: The ACL and PCL are unremarkable. The MCL is normal. Lateral collateral ligament complex is unremarkable. Subtle irregularity of the posterior root of the lateral meniscus. There is a tear of the body and posterior horn of the medial meniscus. Moderate cartilage loss in the medial compartment. Mild loss in the lateral compartment. High-grade patellar cartilage loss with remodeling and chronic subluxation. At least 2 loose bodies and/or foci of fibrosis along the anterior joint line measuring up to 11 mm.
IMPRESSION: 
IMPRESSION: 1.  Intact knee ligaments.
2.  Tear of the medial meniscus. Possible tear of the posterior root of the lateral meniscus.
3.  Tricompartmental cartilage loss, worst in the patellofemoral compartment where it is high-grade with chronic subluxation and remodeling.
4.  At least 2 loose bodies and/or foci of fibrosis along the anterior joint line measuring up to 11 mm. Other small loose bodies
